# Patient Record
Sex: Male | Born: 1970 | Race: White | Hispanic: No | Marital: Married | Smoking: Former smoker
Health system: Southern US, Community
[De-identification: ages and names within clinical notes are randomized; demographics above are authoritative.]

## PROBLEM LIST (undated history)

## (undated) DIAGNOSIS — E291 Testicular hypofunction: Secondary | ICD-10-CM

## (undated) DIAGNOSIS — K219 Gastro-esophageal reflux disease without esophagitis: Secondary | ICD-10-CM

## (undated) DIAGNOSIS — T7840XA Allergy, unspecified, initial encounter: Secondary | ICD-10-CM

## (undated) HISTORY — PX: HERNIA REPAIR: SHX51

## (undated) HISTORY — DX: Testicular hypofunction: E29.1

## (undated) HISTORY — DX: Allergy, unspecified, initial encounter: T78.40XA

---

## 2002-01-06 HISTORY — PX: BACK SURGERY: SHX140

## 2002-11-30 ENCOUNTER — Observation Stay (HOSPITAL_COMMUNITY): Admission: RE | Admit: 2002-11-30 | Discharge: 2002-12-01 | Payer: Self-pay | Admitting: Orthopedic Surgery

## 2004-01-07 HISTORY — PX: FOOT SURGERY: SHX648

## 2005-07-28 ENCOUNTER — Encounter: Admission: RE | Admit: 2005-07-28 | Discharge: 2005-07-28 | Payer: Self-pay | Admitting: Family Medicine

## 2006-02-11 ENCOUNTER — Encounter: Admission: RE | Admit: 2006-02-11 | Discharge: 2006-02-11 | Payer: Self-pay | Admitting: Family Medicine

## 2006-07-07 ENCOUNTER — Emergency Department (HOSPITAL_COMMUNITY): Admission: EM | Admit: 2006-07-07 | Discharge: 2006-07-07 | Payer: Self-pay | Admitting: Emergency Medicine

## 2006-07-22 ENCOUNTER — Encounter (HOSPITAL_COMMUNITY): Admission: RE | Admit: 2006-07-22 | Discharge: 2006-09-11 | Payer: Self-pay | Admitting: Emergency Medicine

## 2007-09-21 ENCOUNTER — Encounter: Admission: RE | Admit: 2007-09-21 | Discharge: 2007-09-21 | Payer: Self-pay | Admitting: Family Medicine

## 2008-01-07 HISTORY — PX: OTHER SURGICAL HISTORY: SHX169

## 2008-08-17 ENCOUNTER — Ambulatory Visit: Payer: Self-pay | Admitting: Family Medicine

## 2008-10-10 ENCOUNTER — Ambulatory Visit: Payer: Self-pay | Admitting: Family Medicine

## 2008-10-30 ENCOUNTER — Ambulatory Visit: Payer: Self-pay | Admitting: Family Medicine

## 2008-12-19 ENCOUNTER — Ambulatory Visit: Payer: Self-pay | Admitting: Family Medicine

## 2009-04-10 ENCOUNTER — Ambulatory Visit: Payer: Self-pay | Admitting: Family Medicine

## 2009-04-13 ENCOUNTER — Ambulatory Visit: Payer: Self-pay | Admitting: Family Medicine

## 2009-05-16 ENCOUNTER — Ambulatory Visit: Payer: Self-pay | Admitting: Family Medicine

## 2009-09-25 ENCOUNTER — Ambulatory Visit: Payer: Self-pay | Admitting: Family Medicine

## 2009-10-25 ENCOUNTER — Ambulatory Visit: Payer: Self-pay | Admitting: Family Medicine

## 2009-10-29 ENCOUNTER — Ambulatory Visit: Payer: Self-pay | Admitting: Family Medicine

## 2009-12-12 ENCOUNTER — Ambulatory Visit: Payer: Self-pay | Admitting: Family Medicine

## 2010-02-08 ENCOUNTER — Ambulatory Visit (HOSPITAL_BASED_OUTPATIENT_CLINIC_OR_DEPARTMENT_OTHER)
Admission: RE | Admit: 2010-02-08 | Discharge: 2010-02-08 | Disposition: A | Discharge status: Home / Self Care | Payer: BC Managed Care – PPO | Attending: Orthopedic Surgery | Admitting: Orthopedic Surgery

## 2010-02-08 DIAGNOSIS — M23329 Other meniscus derangements, posterior horn of medial meniscus, unspecified knee: Secondary | ICD-10-CM | POA: Insufficient documentation

## 2010-02-08 DIAGNOSIS — M2240 Chondromalacia patellae, unspecified knee: Secondary | ICD-10-CM | POA: Insufficient documentation

## 2010-02-08 LAB — POCT I-STAT, CHEM 8
BUN: 25 mg/dL — ABNORMAL HIGH (ref 6–23)
Calcium, Ion: 1.15 mmol/L (ref 1.12–1.32)
Chloride: 104 mEq/L (ref 96–112)
Creatinine, Ser: 0.9 mg/dL (ref 0.4–1.5)
Glucose, Bld: 101 mg/dL — ABNORMAL HIGH (ref 70–99)
HCT: 41 % (ref 39.0–52.0)
Hemoglobin: 13.9 g/dL (ref 13.0–17.0)
Potassium: 3.7 mEq/L (ref 3.5–5.1)
Sodium: 142 mEq/L (ref 135–145)
TCO2: 29 mmol/L (ref 0–100)

## 2010-02-12 NOTE — Op Note (Signed)
Jeff Hoffman, Jeff Hoffman               ACCOUNT NO.:  [id number]  MEDICAL RECORD NO.:  [id number]           PATIENT TYPE:  LOCATION:                                 FACILITY:  PHYSICIAN:  Harvie Junior, M.D.   DATE OF BIRTH:  Dec 13, 1970  DATE OF PROCEDURE:  02/08/2010 DATE OF DISCHARGE:                              OPERATIVE REPORT   PREOPERATIVE DIAGNOSIS:  Medial meniscal tear.  POSTOPERATIVE DIAGNOSES: 1. Medial meniscal tear. 2. 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  SURGEON:  Harvie Junior, MD  ASSISTANT:  None.  ANESTHESIA:  General.  SURGICAL PROCEDURES: 1. Partial posterior medial horn meniscectomy. 2. Chondroplasty, patellofemoral joint.  BRIEF HISTORY:  Mr. Beegle is a gentleman who has had a long history of knee pain with catching and locking.  He was treated conservatively for a long period of time.  After failure of conservative care, he was ultimately taken to the operating room for knee arthroscopy. Preoperative MRI showed that he had a posterior horn medial meniscal tear.  PROCEDURE:  The patient was taken to the operating room.  After adequate level of anesthesia was obtained with general anesthetic, the patient was placed supine on the operating table.  The right leg was then prepped and draped in the usual sterile fashion.  Following this, routine arthroscopic examination of knee revealed there was an obvious trochlear chondromalacia.  This was debrided back to a smooth and stable rim.  There was a bit of the medial shelf plica which was debrided getting into the medial compartment.  There was a classic posterior horn medial meniscal tear, essentially horizontal in nature with some complex features.  This went from almost all the way posterior by the root around to just sure shot of the midbody.  We resected this with a combination of straight biting forceps and upbiting forceps, and remaining meniscal rim was contoured down  with a suction shaver.  Once this was completed, a probe was used to make sure there was no loosened or fragmenting pieces of meniscus.  Once this was accepted, the knee was thoroughly irrigated.  Attention was turned to the notch.  The ACL was normal, lateral side normal, back up into the trochlea looking and cleaning up the defect here.  At this point, the knee was copiously and thoroughly lavaged and suctioned dry.  We milked the back of the knee to get out any loose or fragment pieces of meniscus and cartilage.  Seeing none, the knee was again thoroughly irrigated, suctioned dry, irrigated with 6 liters of normal saline irrigation, suctioned dry.  The knee arthroscopic portals were closed with bandage.  20 mL of one 0.25% Marcaine was instilled into around the portals and into the knee for postoperative pain control.  The patient was taken to the recovery room where he was noted to be in satisfactory condition.  Estimated blood loss of procedure was none.     Harvie Junior, M.D.     Ranae Plumber  D:  02/08/2010  T:  02/09/2010  Job:  [id number]  Electronically Signed by Jodi Geralds M.D. on 02/12/2010 01:47:02 PM

## 2010-02-18 ENCOUNTER — Ambulatory Visit (INDEPENDENT_AMBULATORY_CARE_PROVIDER_SITE_OTHER): Payer: BC Managed Care – PPO | Admitting: Family Medicine

## 2010-02-18 DIAGNOSIS — B070 Plantar wart: Secondary | ICD-10-CM

## 2010-04-09 ENCOUNTER — Ambulatory Visit (INDEPENDENT_AMBULATORY_CARE_PROVIDER_SITE_OTHER): Payer: BC Managed Care – PPO | Admitting: Family Medicine

## 2010-04-09 DIAGNOSIS — B070 Plantar wart: Secondary | ICD-10-CM

## 2010-04-09 DIAGNOSIS — R5381 Other malaise: Secondary | ICD-10-CM

## 2010-04-09 DIAGNOSIS — R5383 Other fatigue: Secondary | ICD-10-CM

## 2010-04-09 DIAGNOSIS — R6889 Other general symptoms and signs: Secondary | ICD-10-CM

## 2010-04-11 ENCOUNTER — Ambulatory Visit (INDEPENDENT_AMBULATORY_CARE_PROVIDER_SITE_OTHER): Payer: BC Managed Care – PPO

## 2010-04-11 DIAGNOSIS — Z79899 Other long term (current) drug therapy: Secondary | ICD-10-CM

## 2010-04-15 ENCOUNTER — Ambulatory Visit: Payer: BC Managed Care – PPO

## 2010-05-10 ENCOUNTER — Other Ambulatory Visit (INDEPENDENT_AMBULATORY_CARE_PROVIDER_SITE_OTHER): Payer: BC Managed Care – PPO

## 2010-05-10 DIAGNOSIS — R6882 Decreased libido: Secondary | ICD-10-CM

## 2010-05-24 NOTE — Op Note (Signed)
NAME:  Jeff Hoffman, Jeff Hoffman                         ACCOUNT NO.:  [id number]   MEDICAL RECORD NO.:  [id number]                   PATIENT TYPE:  AMB   LOCATION:  DAY                                  FACILITY:  [facility]   PHYSICIAN:  Georges Lynch. Darrelyn Hillock, M.D.             DATE OF BIRTH:  29-Oct-1970   DATE OF PROCEDURE:  11/30/2002  DATE OF DISCHARGE:                                 OPERATIVE REPORT   SURGEON:  Georges Lynch. Darrelyn Hillock, M.D.   ASSISTANT:  Ronnell Guadalajara, M.D.   PREOPERATIVE DIAGNOSIS:  Large herniated lumbar disk at L5-S1 on the left.   POSTOPERATIVE DIAGNOSIS:  Large herniated lumbar disk at L5-S1 on the left.   OPERATION:  Hemilaminectomy and microdiskectomy at L5-S1 on the left.   DESCRIPTION OF PROCEDURE:  Under general anesthesia, the patient first had 1  g of IV Ancef. At this time, two needles were placed over the back, x-rays  were taken to verify our position. Following this, we made an incision over  the L5-S1 interspace. Bleeders identified and cauterized. The incision was  carried down to the lamina and spinous processes of L5-S1. Another x-ray was  taken to verify our position and we compared the plain x-ray lateral view  with the MRI and verified that we were in the proper position. We then  carried out a hemilaminectomy in the usual fashion. We removed the  ligamentum flavum with great care taken not to injure the underlying dura.  We then identified the nerve root and then gently retracted the root, went  down and he had multiple loose pieces of disk material actually that came  out of the disk space and were literally pressing on the nerve. We removed  those gently. I then went down and made a cruciate incision in the space,  there was barely anything in the space, it was a very narrow space. I  cleaned that out and then searched further for more fragments. We down to  check the root. We were able to easily pass a Penfield 4 up around the root  and the nerve root and  the dura now was free and there were no other free  fragments. We did go subligamentous as well with the Epstein curettes and  made sure there were no other loose pieces. We thoroughly irrigated out the  area, the nerve root now was free as well as the dura. I then loosely  applied some thrombin soaked Gelfoam and closed the wound in layers in the  usual fashion. The deep proximal portion of the wound was left open for  drainage purposes. The remaining part of the wound was closed in the usual  fashion. The skin was closed with metal staples. A sterile Neosporin  dressing was applied and the patient left the operating room in satisfactory  condition.  Ronald A. Darrelyn Hillock, M.D.    RAG/MEDQ  D:  11/30/2002  T:  11/30/2002  Job:  [id number]

## 2010-06-21 ENCOUNTER — Ambulatory Visit (INDEPENDENT_AMBULATORY_CARE_PROVIDER_SITE_OTHER): Payer: BC Managed Care – PPO | Admitting: Family Medicine

## 2010-06-21 ENCOUNTER — Encounter: Payer: Self-pay | Admitting: Family Medicine

## 2010-06-21 VITALS — BP 120/80 | Temp 98.0°F | Wt 194.0 lb

## 2010-06-21 DIAGNOSIS — N419 Inflammatory disease of prostate, unspecified: Secondary | ICD-10-CM

## 2010-06-21 DIAGNOSIS — N390 Urinary tract infection, site not specified: Secondary | ICD-10-CM

## 2010-06-21 DIAGNOSIS — Z79899 Other long term (current) drug therapy: Secondary | ICD-10-CM

## 2010-06-21 DIAGNOSIS — E291 Testicular hypofunction: Secondary | ICD-10-CM

## 2010-06-21 LAB — POCT URINALYSIS DIPSTICK
Bilirubin, UA: NEGATIVE
Blood, UA: 250
Glucose, UA: NEGATIVE
Ketones, UA: NEGATIVE
Nitrite, UA: NEGATIVE
Spec Grav, UA: 1.015
Urobilinogen, UA: NEGATIVE
pH, UA: 7

## 2010-06-21 LAB — TESTOSTERONE: Testosterone: 486.09 ng/dL (ref 250–890)

## 2010-06-21 MED ORDER — DOXYCYCLINE HYCLATE 100 MG PO TABS: 100.0000 mg | ORAL_TABLET | Freq: Two times a day (BID) | ORAL | Status: AC

## 2010-06-21 NOTE — Patient Instructions (Signed)
Take all the antibiotic and if not totally back to normal, give me a call

## 2010-06-21 NOTE — Progress Notes (Signed)
  Subjective:    Patient ID: Jeff Hoffman, male    DOB: [birth year], 40 y.o.   MRN: [id number]  HPI He has a one-week history of dysuria and now recent discharge. He has had some cloudy urine in the past. He has had some recent sexual activity but states it was safe. He is again having back pain. He recently had an injection procedure for his back for treatment of facet pain. He continues on testosterone replacement and is having no difficulty with this.  Review of Systems     Objective:   Physical Exam Alert and in no distress. Abdominal exam shows no masses or tenderness. Genitalia and penis normal. GC committee culture taken. Rectal exam does show a tender boggy prostate reproduces symptoms.       Assessment & Plan:  Prostatitis. Possible STD. Hypogonadism. GC committee culture taken. I will place him on doxycycline to help with this. Testosterone level will be drawn.

## 2010-06-22 LAB — GC/CHLAMYDIA PROBE AMP, GENITAL
Chlamydia, DNA Probe: NEGATIVE
GC Probe Amp, Genital: NEGATIVE

## 2010-06-24 ENCOUNTER — Telehealth: Payer: Self-pay

## 2010-06-24 ENCOUNTER — Ambulatory Visit (INDEPENDENT_AMBULATORY_CARE_PROVIDER_SITE_OTHER): Payer: BC Managed Care – PPO | Admitting: Medical

## 2010-06-24 ENCOUNTER — Encounter: Payer: Self-pay | Admitting: Medical

## 2010-06-24 VITALS — BP 150/100 | HR 83 | Temp 98.0°F | Ht 74.0 in | Wt 200.0 lb

## 2010-06-24 DIAGNOSIS — N419 Inflammatory disease of prostate, unspecified: Secondary | ICD-10-CM

## 2010-06-24 DIAGNOSIS — R030 Elevated blood-pressure reading, without diagnosis of hypertension: Secondary | ICD-10-CM

## 2010-06-24 DIAGNOSIS — N453 Epididymo-orchitis: Secondary | ICD-10-CM

## 2010-06-24 DIAGNOSIS — N451 Epididymitis: Secondary | ICD-10-CM

## 2010-06-24 MED ORDER — CIPROFLOXACIN HCL 500 MG PO TABS: 500.0000 mg | ORAL_TABLET | Freq: Two times a day (BID) | ORAL | Status: AC

## 2010-06-24 NOTE — Patient Instructions (Signed)
Call report in 1 to 2 days.

## 2010-06-24 NOTE — Telephone Encounter (Signed)
Had to leave message to call me back

## 2010-06-24 NOTE — Progress Notes (Signed)
Subjective:   HPI  Jeff Hoffman is a 40 y.o. male who presents for recheck.  He was here last week late in the week and saw Dr. Susann Givens for prostatitis and penile discharge.  Had swabs taken, and started on Doxycycline.  However, he is back today due to new and worse symptoms over the weekend.  Over the weekend had severe abdominal and back pain with associated constipation that eventually resolved.  Has had no more penile discharge, but he does note left testicle and scrotum with warmth, redness, swelling, and very tender.  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 He has never had swelling and pain in the testicle such as the current symptoms.  He has had some ongoing lower abdominal pain, but the worse issue is the testicle.   Denies perineal pain, rectal pain, no penile discharge or urinary issues currently.  Denies any recent trauma or injury to the abdominal, back, or pelvic region.  No sexual activity since before his last visit.  No other aggravating or relieving factors.  No other c/o.  The following portions of the patient's history were reviewed and updated as appropriate: allergies, current medications, past family history, past medical history, past social history, past surgical history and problem list.  Past Medical History  Diagnosis Date  . Allergy   . Asthma   . Hypogonadism male     Review of Systems Constitutional: denies fever, chills, sweats, unexpected weight change, anorexia, fatigue Dermatology: denies rash Cardiology: denies chest pain, palpitations, edema Respiratory: denies cough, shortness of breath, wheezing Gastroenterology: denies nausea, vomiting, diarrhea Hematology: denies bleeding or bruising problems Musculoskeletal: denies arthralgias, myalgias, joint swelling Urology: denies dysuria, difficulty urinating, hematuria, urinary frequency,  urgency Neurology: no headache, weakness, tingling, numbness     Objective:   Physical Exam  General appearance: alert, no distress, WD/WN, in pain, white male Abdomen: +bs, soft, mild suprapubic tenderness, otherwise non tender, non distended, no masses, no hepatomegaly, no splenomegaly Back: non tender Musculoskeletal: nontender, no swelling, no obvious deformity GU: left testicle and epididymal region with erythema, swelling, and quite tender throughout. No induration or fluctuance.  Otherwise GU exam unremarkable.   Extremities: no edema, no cyanosis, no clubbing Pulses: 2+ symmetric, upper and lower extremities, normal cap refill Rectal exam: deferred.   Assessment :    Encounter Diagnoses  Name Primary?  . Epididymitis, left Yes  . Prostatitis   . Elevated blood pressure reading without diagnosis of hypertension     Plan:  Reviewed office notes from last week.  Reviewed his negative GC and chlamydia results.  Epididymitis and prostatitis - change to Cipro x 2 wk.  If any residual symptoms at 2 wk, will c/t for  1 whole month of Cipro given hx/o chronic prostatitis.  Advised he call report in 1-2 days.  If not improving or worse, we will consider CBC, repeat urine, and testicular ultrasound.  Elevated BP- recheck with nurse visit in 1wk.

## 2010-06-24 NOTE — Telephone Encounter (Signed)
Pt infomed of labs

## 2010-06-26 ENCOUNTER — Telehealth: Payer: Self-pay | Admitting: *Deleted

## 2010-06-26 NOTE — Telephone Encounter (Signed)
Done

## 2010-07-09 ENCOUNTER — Encounter: Payer: Self-pay | Admitting: Family Medicine

## 2010-07-09 ENCOUNTER — Ambulatory Visit (INDEPENDENT_AMBULATORY_CARE_PROVIDER_SITE_OTHER): Payer: BC Managed Care – PPO | Admitting: Family Medicine

## 2010-07-09 DIAGNOSIS — N410 Acute prostatitis: Secondary | ICD-10-CM

## 2010-07-09 DIAGNOSIS — E291 Testicular hypofunction: Secondary | ICD-10-CM

## 2010-07-09 LAB — POCT URINALYSIS DIPSTICK
Bilirubin, UA: NEGATIVE
Blood, UA: NEGATIVE
Glucose, UA: NEGATIVE
Ketones, UA: NEGATIVE
Leukocytes, UA: NEGATIVE
Nitrite, UA: NEGATIVE
Protein, UA: NEGATIVE
Spec Grav, UA: 1.02
Urobilinogen, UA: NEGATIVE
pH, UA: 5

## 2010-07-09 NOTE — Patient Instructions (Signed)
Try the Viagra at least an hour before sexual activity. For this we'll get things to return to normal. If not there are more things we can do.

## 2010-07-09 NOTE — Progress Notes (Signed)
  Subjective:    Patient ID: Jeff Hoffman, male    DOB: Jul 19, 1970, 40 y.o.   MRN: [id number]  HPI He is here for recheck. He is having no testicular, perineal or back pain He also continues to have difficulty with getting and maintaining erections. He dates this to potentially when he was using sex toys including the use of electricity. His testosterone level is in the high 400 range. He is also interested in potentially having the testosterone pellets. Review of Systems     Objective:   Physical Exam Alert and in no distress otherwise not examined. Urinalysis microscopic was negative       Assessment & Plan:  Prostatitis/epididymitis. ED. Hypogonadism I will refer him to urology for consult concerning the pellets.

## 2010-07-16 ENCOUNTER — Telehealth: Payer: Self-pay | Admitting: Family Medicine

## 2010-07-16 NOTE — Telephone Encounter (Signed)
Have him make an appointment with me so I can look at it and save him a trip to the dermatologist

## 2010-07-17 ENCOUNTER — Telehealth: Payer: Self-pay

## 2010-07-17 NOTE — Telephone Encounter (Signed)
Pt dosent want to come in he wants to go to derm

## 2010-08-19 ENCOUNTER — Other Ambulatory Visit: Payer: Self-pay | Admitting: Family Medicine

## 2010-08-19 MED ORDER — MONTELUKAST SODIUM 10 MG PO TABS: 10.0000 mg | ORAL_TABLET | Freq: Every day | ORAL | Status: DC

## 2010-08-21 ENCOUNTER — Encounter: Payer: Self-pay | Admitting: Family Medicine

## 2010-09-02 ENCOUNTER — Ambulatory Visit (INDEPENDENT_AMBULATORY_CARE_PROVIDER_SITE_OTHER): Payer: BC Managed Care – PPO | Admitting: Family Medicine

## 2010-09-02 ENCOUNTER — Encounter: Payer: Self-pay | Admitting: Family Medicine

## 2010-09-02 VITALS — BP 118/76 | HR 63 | Wt 204.0 lb

## 2010-09-02 DIAGNOSIS — L739 Follicular disorder, unspecified: Secondary | ICD-10-CM

## 2010-09-02 DIAGNOSIS — Z23 Encounter for immunization: Secondary | ICD-10-CM

## 2010-09-02 DIAGNOSIS — L738 Other specified follicular disorders: Secondary | ICD-10-CM

## 2010-09-02 DIAGNOSIS — B001 Herpesviral vesicular dermatitis: Secondary | ICD-10-CM

## 2010-09-02 DIAGNOSIS — Z209 Contact with and (suspected) exposure to unspecified communicable disease: Secondary | ICD-10-CM

## 2010-09-02 DIAGNOSIS — E291 Testicular hypofunction: Secondary | ICD-10-CM

## 2010-09-02 DIAGNOSIS — B009 Herpesviral infection, unspecified: Secondary | ICD-10-CM

## 2010-09-02 DIAGNOSIS — H6590 Unspecified nonsuppurative otitis media, unspecified ear: Secondary | ICD-10-CM

## 2010-09-02 DIAGNOSIS — H6592 Unspecified nonsuppurative otitis media, left ear: Secondary | ICD-10-CM

## 2010-09-02 MED ORDER — ACYCLOVIR 400 MG PO TABS: 400.0000 mg | ORAL_TABLET | Freq: Two times a day (BID) | ORAL | Status: AC

## 2010-09-02 MED ORDER — EMTRICITABINE-TENOFOVIR DF 200-300 MG PO TABS: 1.0000 | ORAL_TABLET | Freq: Every day | ORAL | Status: DC

## 2010-09-02 NOTE — Progress Notes (Signed)
  Subjective:    Patient ID: Jeff Hoffman, male    DOB: [birth year], 40 y.o.   MRN: [id number]  HPI He complains of feeling that the left ear is plugged. He's had no earache, sore throat, cough or congestion. He does get occasional outbreaks of folliculitis mainly on his legs. He uses regular soap and does February which does help. He also has questions about STD exposure. He is interested in being placed on Truvada to help reduce his potential exposure. He is being followed by urology for hypogonadism and at this time he is not using testosterone. He would also like to switch to a less expensive medication for his herpes.   Review of Systems     Objective:   Physical Exam alert and in no distress. Tympanic membranes and canals are normal. Throat is clear. Tonsils are normal. Neck is supple without adenopathy or thyromegaly. Cardiac exam shows a regular sinus rhythm without murmurs or gallops. Lungs are clear to auscultation. Exam of his legs shows no lesions.      Assessment & Plan:  Probable otitis media. STD exposure. Folliculitis. Hypogonadism Over 45 minutes spent discussing all these issues with him. I will switch him to acyclovir. I will also start him on Truvada. We discussed the need for him to continue to practice safe sex. I would check an RPR and HIV. We'll also get notes from neurology. Continue to treat his skin follow him as he has in the past.

## 2010-09-03 ENCOUNTER — Telehealth: Payer: Self-pay

## 2010-09-03 LAB — RPR

## 2010-09-03 LAB — HIV ANTIBODY (ROUTINE TESTING W REFLEX): HIV: NONREACTIVE

## 2010-09-03 NOTE — Telephone Encounter (Signed)
Called pt to inform him labs are neg.left message

## 2010-09-10 ENCOUNTER — Other Ambulatory Visit: Payer: Self-pay | Admitting: Family Medicine

## 2010-09-10 MED ORDER — AMOXICILLIN-POT CLAVULANATE 875-125 MG PO TABS: 1.0000 | ORAL_TABLET | Freq: Two times a day (BID) | ORAL | Status: AC

## 2010-09-10 MED ORDER — ALBUTEROL SULFATE HFA 108 (90 BASE) MCG/ACT IN AERS: 2.0000 | INHALATION_SPRAY | Freq: Four times a day (QID) | RESPIRATORY_TRACT | Status: DC | PRN

## 2010-09-10 NOTE — Telephone Encounter (Signed)
Pt called again and in a huff wanted to know when we were going to call something in wants to pick up at lunch per sharron

## 2010-09-10 NOTE — Telephone Encounter (Signed)
His cold symptoms have gotten worse with more chest and nasal congestion as well as a slightly productive cough. I will give him an inhaler and Augmentin which has worked in the past.

## 2010-09-10 NOTE — Telephone Encounter (Signed)
Pt said self treating not working.  Pt now has bronchitis.  Flying out tomorrow am and needs antibiotic and new rx for Albuterol.   NEEDS BY LUNCH at [facility]

## 2010-11-18 ENCOUNTER — Other Ambulatory Visit: Payer: Self-pay | Admitting: Family Medicine

## 2010-11-18 NOTE — Telephone Encounter (Signed)
Is this okay?

## 2010-12-16 ENCOUNTER — Other Ambulatory Visit: Payer: Self-pay | Admitting: Family Medicine

## 2011-01-13 ENCOUNTER — Other Ambulatory Visit: Payer: Self-pay | Admitting: Family Medicine

## 2011-02-17 ENCOUNTER — Other Ambulatory Visit: Payer: Self-pay | Admitting: Family Medicine

## 2011-02-17 NOTE — Telephone Encounter (Signed)
Is this okay?

## 2011-03-17 ENCOUNTER — Other Ambulatory Visit: Payer: Self-pay | Admitting: Family Medicine

## 2011-04-01 ENCOUNTER — Encounter: Payer: Self-pay | Admitting: Medical

## 2011-04-01 ENCOUNTER — Ambulatory Visit (INDEPENDENT_AMBULATORY_CARE_PROVIDER_SITE_OTHER): Payer: BC Managed Care – PPO | Admitting: Medical

## 2011-04-01 VITALS — BP 110/80 | HR 60 | Temp 98.0°F | Resp 16 | Wt 201.0 lb

## 2011-04-01 DIAGNOSIS — Z113 Encounter for screening for infections with a predominantly sexual mode of transmission: Secondary | ICD-10-CM

## 2011-04-01 DIAGNOSIS — K529 Noninfective gastroenteritis and colitis, unspecified: Secondary | ICD-10-CM

## 2011-04-01 DIAGNOSIS — K5289 Other specified noninfective gastroenteritis and colitis: Secondary | ICD-10-CM

## 2011-04-01 NOTE — Progress Notes (Signed)
Subjective: Here for complaint of stomach bug.  This past weekend he was out of town [address], been traveling, and this past Saturday 3 nights ago started having uncontrollable diarrhea, 10-12 episodes Saturday night. Diarrhea was watery, and one time he actually defecated in the sleep. On Saturday night he also had one big episode of nausea and vomiting.  Her last 2 days he has continued to have diarrhea but only about 5 times a day compared to Saturday.  He has not had any more nausea or vomiting.  He does feel dehydrated at this point, no energy, he tried to go back to work today but couldn't due to fatigue.  Denies belly pain, just discomfort in general.  He denies any recent antibiotics, no recent animal exposure, no recent illness or hospitalization. No sick contacts with similar symptoms. He also reports that he normally gets an HIV screen every few months. He would like this today in light of his symptoms.   Past Medical History  Diagnosis Date  . Allergy   . Asthma   . Hypogonadism male    ROS: Gen.: No fever, chills, sweats, weight changes HEENT: Runny nose, otherwise negative Heart: No chest pain or palpitation Lungs: No shortness of breath or wheezing GU: Negative    Objective:   Physical Exam  Filed Vitals:   04/01/11 1153  BP: 110/80  Pulse: 60  Temp: 98 F (36.7 C)  Resp: 16    General appearance: alert, no distress, WD/WN Skin: Unremarkable, no jaundice, no pallor, no skin tenting Eyes: Not icteric, mild pallor Oral cavity: MMM, no lesions Neck: supple, no lymphadenopathy, no thyromegaly, no masses Heart: RRR, normal S1, S2, no murmurs Lungs: CTA bilaterally, no wheezes, rhonchi, or rales Abdomen: +bs, soft, mild generalized tenderness, non distended, no masses, no hepatomegaly, no splenomegaly Pulses: 2+ symmetric, normal cap refill  Assessment and Plan :    Encounter Diagnoses  Name Primary?  . Gastroenteritis Yes  . Screening for venereal disease     Currently symptoms suggest viral gastroenteritis.  Discussed supportive care, rehydration, rest, and discussed signs of worsening infection that would prompt a call back or recheck including fever, blood in stool, worsening diarrhea or nausea and vomiting.  Advised he limit Imodium to 1-2 a day. Handout given. HIV antibody today.  Followup pending labs

## 2011-04-01 NOTE — Patient Instructions (Signed)
Viral Gastroenteritis Viral gastroenteritis is also known as stomach flu. This condition affects the stomach and intestinal tract. The illness typically lasts 3 to 8 days. Most people develop an immune response. This eventually gets rid of the virus. While this natural response develops, the virus can make you quite ill.  CAUSES  Diarrhea and vomiting are often caused by a virus. Medicines (antibiotics) that kill germs will not help unless there is also a germ (bacterial) infection. SYMPTOMS  The most common symptom is diarrhea. This can cause severe loss of fluids (dehydration) and body salt (electrolyte) imbalance. TREATMENT  Treatments for this illness are aimed at rehydration. Antidiarrheal medicines are not recommended. They do not decrease diarrhea volume and may be harmful. Usually, home treatment is all that is needed. The most serious cases involve vomiting so severely that you are not able to keep down fluids taken by mouth (orally). In these cases, intravenous (IV) fluids are needed. Vomiting with viral gastroenteritis is common, but it will usually go away with treatment. HOME CARE INSTRUCTIONS  Small amounts of fluids should be taken frequently. Large amounts at one time may not be tolerated. Plain water may be harmful in infants and the elderly. Oral rehydration solutions (ORS) are available at pharmacies and grocery stores. ORS replace water and important electrolytes in proper proportions. Sports drinks are not as effective as ORS and may be harmful due to sugars worsening diarrhea.  As a general guideline for children, replace any new fluid losses from diarrhea or vomiting with ORS as follows:   If your child weighs 22 pounds or under (10 kg or less), give 60-120 mL (1/4 - 1/2 cup or 2 - 4 ounces) of ORS for each diarrheal stool or vomiting episode.   If your child weighs more than 22 pounds (more than 10 kgs), give 120-240 mL (1/2 - 1 cup or 4 - 8 ounces) of ORS for each diarrheal  stool or vomiting episode.   In a child with vomiting, it may be helpful to give the above ORS replacement in 5 mL (1 teaspoon) amounts every 5 minutes, then increase as tolerated.   While correcting for dehydration, children should eat normally. However, foods high in sugar should be avoided because this may worsen diarrhea. Large amounts of carbonated soft drinks, juice, gelatin desserts, and other highly sugared drinks should be avoided.   After correction of dehydration, other liquids that are appealing to the child may be added. Children should drink small amounts of fluids frequently and fluids should be increased as tolerated.   Adults should eat normally while drinking more fluids than usual. Drink small amounts of fluids frequently and increase as tolerated. Drink enough water and fluids to keep your urine clear or pale yellow. Broths, weak decaffeinated tea, lemon-lime soft drinks (allowed to go flat), and ORS replace fluids and electrolytes.   Avoid:   Carbonated drinks.   Juice.   Extremely hot or cold fluids.   Caffeine drinks.   Fatty, greasy foods.   Alcohol.   Tobacco.   Too much intake of anything at one time.   Gelatin desserts.   Probiotics are active cultures of beneficial bacteria. They may lessen the amount and number of diarrheal stools in adults. Probiotics can be found in yogurt with active cultures and in supplements.   Wash your hands well to avoid spreading bacteria and viruses.   Antidiarrheal medicines are not recommended for infants and children.   Only take over-the-counter or prescription medicines for   pain, discomfort, or fever as directed by your caregiver. Do not give aspirin to children.   For adults with dehydration, ask your caregiver if you should continue all prescribed and over-the-counter medicines.   If your caregiver has given you a follow-up appointment, it is very important to keep that appointment. Not keeping the appointment  could result in a lasting (chronic) or permanent injury and disability. If there is any problem keeping the appointment, you must call to reschedule.  SEEK IMMEDIATE MEDICAL CARE IF:   You are unable to keep fluids down.   There is no urine output in 6 to 8 hours or there is only a small amount of very dark urine.   You develop shortness of breath.   There is blood in the vomit (may look like coffee grounds) or stool.   Belly (abdominal) pain develops, increases, or localizes.   There is persistent vomiting or diarrhea.   You have a fever.   Your baby is older than 3 months with a rectal temperature of 102 F (38.9 C) or higher.   Your baby is 3 months old or younger with a rectal temperature of 100.4 F (38 C) or higher.  MAKE SURE YOU:   Understand these instructions.   Will watch your condition.   Will get help right away if you are not doing well or get worse.  Document Released: 12/23/2004 Document Revised: 09/04/2010 Document Reviewed: 05/06/2006 [employer] Patient Information 2012 [employer]. 

## 2011-04-02 LAB — HIV ANTIBODY (ROUTINE TESTING W REFLEX): HIV: NONREACTIVE

## 2011-04-21 ENCOUNTER — Telehealth: Payer: Self-pay | Admitting: Family Medicine

## 2011-04-21 NOTE — Telephone Encounter (Signed)
  Please call patient. He wants to explore other options instead of using Advair.   He states advair made of 2 components that are available separately in generic form. Would like to try taking those instead   Please call     [facility] in [place]    [phone]

## 2011-04-23 ENCOUNTER — Other Ambulatory Visit: Payer: Self-pay | Admitting: Family Medicine

## 2011-04-24 ENCOUNTER — Telehealth: Payer: Self-pay | Admitting: Internal Medicine

## 2011-04-24 MED ORDER — BECLOMETHASONE DIPROPIONATE 80 MCG/ACT IN AERS: 1.0000 | INHALATION_SPRAY | Freq: Two times a day (BID) | RESPIRATORY_TRACT | Status: DC

## 2011-04-24 MED ORDER — OMEPRAZOLE 40 MG PO CPDR: 40.0000 mg | DELAYED_RELEASE_CAPSULE | Freq: Every day | ORAL | Status: DC

## 2011-04-24 NOTE — Telephone Encounter (Signed)
He would like to be switched to a different asthma medication to help with cost. He checked with his insurance and Qvar has good coverage. I explained to him that this is just a steroid and he is willing to try this. He would also like outside for his underlying GERD. Both of these medicines were called in. He will let me know how he tolerates them

## 2011-05-19 ENCOUNTER — Other Ambulatory Visit: Payer: Self-pay | Admitting: Family Medicine

## 2011-06-16 ENCOUNTER — Other Ambulatory Visit: Payer: Self-pay | Admitting: Family Medicine

## 2011-06-20 ENCOUNTER — Telehealth: Payer: Self-pay | Admitting: Family Medicine

## 2011-06-20 ENCOUNTER — Other Ambulatory Visit: Payer: Self-pay

## 2011-06-20 MED ORDER — AZITHROMYCIN 500 MG PO TABS: 1000.0000 mg | ORAL_TABLET | Freq: Once | ORAL | Status: DC

## 2011-06-20 MED ORDER — DOXYCYCLINE HYCLATE 100 MG PO TABS: 100.0000 mg | ORAL_TABLET | Freq: Two times a day (BID) | ORAL | Status: AC

## 2011-06-20 NOTE — Telephone Encounter (Signed)
Pt informed and appt made but he said med you sent was on back order can please call something else in

## 2011-06-20 NOTE — Telephone Encounter (Signed)
Doxycycline called in for presumed STD. I will have him schedule a followup appointment.

## 2011-06-20 NOTE — Telephone Encounter (Signed)
Tell him that I called in the medication and that have him schedule an appointment for sometime next week

## 2011-06-20 NOTE — Telephone Encounter (Signed)
Call in azithromycin 500 mg. 2 pills at one time.

## 2011-06-24 ENCOUNTER — Ambulatory Visit: Payer: BC Managed Care – PPO | Admitting: Family Medicine

## 2011-07-21 ENCOUNTER — Other Ambulatory Visit: Payer: Self-pay | Admitting: Family Medicine

## 2011-07-29 ENCOUNTER — Other Ambulatory Visit: Payer: Self-pay | Admitting: Family Medicine

## 2011-08-20 ENCOUNTER — Other Ambulatory Visit: Payer: Self-pay

## 2011-08-20 MED ORDER — MONTELUKAST SODIUM 10 MG PO TABS: 10.0000 mg | ORAL_TABLET | Freq: Every day | ORAL | Status: DC

## 2011-08-20 MED ORDER — EMTRICITABINE-TENOFOVIR DF 200-300 MG PO TABS: 1.0000 | ORAL_TABLET | Freq: Every day | ORAL | Status: DC

## 2011-09-08 ENCOUNTER — Other Ambulatory Visit: Payer: Self-pay | Admitting: Family Medicine

## 2011-09-09 ENCOUNTER — Telehealth: Payer: Self-pay | Admitting: Family Medicine

## 2011-09-09 NOTE — Telephone Encounter (Signed)
LM

## 2011-09-17 ENCOUNTER — Other Ambulatory Visit: Payer: Self-pay | Admitting: Family Medicine

## 2011-10-14 ENCOUNTER — Other Ambulatory Visit: Payer: Self-pay | Admitting: Family Medicine

## 2011-10-21 ENCOUNTER — Other Ambulatory Visit: Payer: Self-pay | Admitting: Family Medicine

## 2011-11-08 ENCOUNTER — Other Ambulatory Visit: Payer: Self-pay | Admitting: Family Medicine

## 2011-11-10 NOTE — Telephone Encounter (Signed)
Okay to renew but he needs an appointment only give him 30 days

## 2011-11-10 NOTE — Telephone Encounter (Signed)
IS THIS OK 

## 2011-11-19 ENCOUNTER — Other Ambulatory Visit: Payer: Self-pay | Admitting: Family Medicine

## 2011-12-16 ENCOUNTER — Telehealth: Payer: Self-pay | Admitting: Family Medicine

## 2011-12-16 MED ORDER — FLUTICASONE PROPIONATE 50 MCG/ACT NA SUSP: 2.0000 | Freq: Every day | NASAL | Status: DC

## 2011-12-16 MED ORDER — ACYCLOVIR 400 MG PO TABS: 400.0000 mg | ORAL_TABLET | Freq: Two times a day (BID) | ORAL | Status: DC

## 2011-12-16 NOTE — Telephone Encounter (Signed)
I renewed the acyclovir and fluticasone. He is now seeing a physician in [place]. He will have his medications followed by that particular physician. He also is getting HIV testing on regular basis.

## 2012-01-14 ENCOUNTER — Other Ambulatory Visit: Payer: Self-pay | Admitting: Family Medicine

## 2012-01-20 ENCOUNTER — Telehealth: Payer: Self-pay | Admitting: Family Medicine

## 2012-01-20 MED ORDER — EMTRICITABINE-TENOFOVIR DF 200-300 MG PO TABS: 1.0000 | ORAL_TABLET | Freq: Every day | ORAL | Status: DC

## 2012-01-20 MED ORDER — ACYCLOVIR 400 MG PO TABS: 400.0000 mg | ORAL_TABLET | Freq: Two times a day (BID) | ORAL | Status: DC

## 2012-01-20 NOTE — Telephone Encounter (Signed)
Talked with pt he has apt end of mth with new dr so i filled meds 30 days only

## 2012-01-20 NOTE — Telephone Encounter (Signed)
Renew the meds but for one or 2 months. make sure he schedules an appointment to see his new doctor

## 2012-03-16 ENCOUNTER — Other Ambulatory Visit: Payer: Self-pay | Admitting: Family Medicine

## 2012-06-17 ENCOUNTER — Other Ambulatory Visit: Payer: Self-pay | Admitting: Family Medicine

## 2013-02-28 ENCOUNTER — Ambulatory Visit (INDEPENDENT_AMBULATORY_CARE_PROVIDER_SITE_OTHER): Payer: BC Managed Care – PPO | Admitting: Family Medicine

## 2013-02-28 ENCOUNTER — Encounter: Payer: Self-pay | Admitting: Family Medicine

## 2013-02-28 VITALS — BP 118/70 | HR 72 | Ht 73.0 in | Wt 212.0 lb

## 2013-02-28 DIAGNOSIS — K219 Gastro-esophageal reflux disease without esophagitis: Secondary | ICD-10-CM | POA: Insufficient documentation

## 2013-02-28 DIAGNOSIS — E291 Testicular hypofunction: Secondary | ICD-10-CM

## 2013-02-28 DIAGNOSIS — B009 Herpesviral infection, unspecified: Secondary | ICD-10-CM

## 2013-02-28 DIAGNOSIS — Z209 Contact with and (suspected) exposure to unspecified communicable disease: Secondary | ICD-10-CM

## 2013-02-28 DIAGNOSIS — J309 Allergic rhinitis, unspecified: Secondary | ICD-10-CM

## 2013-02-28 DIAGNOSIS — J45909 Unspecified asthma, uncomplicated: Secondary | ICD-10-CM

## 2013-02-28 DIAGNOSIS — Z0000 Encounter for general adult medical examination without abnormal findings: Secondary | ICD-10-CM

## 2013-02-28 LAB — HEMOCCULT GUIAC POC 1CARD (OFFICE)

## 2013-02-28 LAB — LIPID PANEL
Cholesterol: 154 mg/dL (ref 0–200)
HDL: 33 mg/dL — ABNORMAL LOW (ref >39)
LDL CALC: 105 mg/dL — AB (ref 0–99)
TRIGLYCERIDES: 79 mg/dL (ref <150)
Total CHOL/HDL Ratio: 4.7 Ratio
VLDL: 16 mg/dL (ref 0–40)

## 2013-02-28 LAB — COMPREHENSIVE METABOLIC PANEL
ALK PHOS: 81 U/L (ref 39–117)
ALT: 81 U/L — ABNORMAL HIGH (ref 0–53)
AST: 152 U/L — AB (ref 0–37)
Albumin: 4.7 g/dL (ref 3.5–5.2)
BUN: 22 mg/dL (ref 6–23)
CO2: 27 mEq/L (ref 19–32)
Calcium: 9.2 mg/dL (ref 8.4–10.5)
Chloride: 102 mEq/L (ref 96–112)
Creat: 0.85 mg/dL (ref 0.50–1.35)
Glucose, Bld: 97 mg/dL (ref 70–99)
Potassium: 4 mEq/L (ref 3.5–5.3)
Sodium: 139 mEq/L (ref 135–145)
Total Bilirubin: 0.5 mg/dL (ref 0.2–1.2)
Total Protein: 7 g/dL (ref 6.0–8.3)

## 2013-02-28 LAB — RPR

## 2013-02-28 LAB — CBC WITH DIFFERENTIAL/PLATELET
BASOS ABS: 0 10*3/uL (ref 0.0–0.1)
BASOS PCT: 0 % (ref 0–1)
EOS PCT: 1 % (ref 0–5)
Eosinophils Absolute: 0 10*3/uL (ref 0.0–0.7)
HEMATOCRIT: 41 % (ref 39.0–52.0)
Hemoglobin: 14 g/dL (ref 13.0–17.0)
Lymphocytes Relative: 30 % (ref 12–46)
Lymphs Abs: 1.4 10*3/uL (ref 0.7–4.0)
MCH: 29.5 pg (ref 26.0–34.0)
MCHC: 34.1 g/dL (ref 30.0–36.0)
MCV: 86.3 fL (ref 78.0–100.0)
MONO ABS: 0.5 10*3/uL (ref 0.1–1.0)
Monocytes Relative: 10 % (ref 3–12)
Neutro Abs: 2.7 10*3/uL (ref 1.7–7.7)
Neutrophils Relative %: 59 % (ref 43–77)
Platelets: 182 10*3/uL (ref 150–400)
RBC: 4.75 MIL/uL (ref 4.22–5.81)
RDW: 14 % (ref 11.5–15.5)
WBC: 4.6 10*3/uL (ref 4.0–10.5)

## 2013-02-28 LAB — MAGNESIUM: MAGNESIUM: 1.9 mg/dL (ref 1.5–2.5)

## 2013-02-28 MED ORDER — EMTRICITABINE-TENOFOVIR DF 200-300 MG PO TABS: 1.0000 | ORAL_TABLET | Freq: Every day | ORAL | Status: DC

## 2013-02-28 MED ORDER — BECLOMETHASONE DIPROPIONATE 80 MCG/ACT IN AERS: 1.0000 | INHALATION_SPRAY | Freq: Two times a day (BID) | RESPIRATORY_TRACT | Status: DC

## 2013-02-28 MED ORDER — MONTELUKAST SODIUM 10 MG PO TABS: ORAL_TABLET | ORAL | Status: DC

## 2013-02-28 MED ORDER — FLUTICASONE PROPIONATE 50 MCG/ACT NA SUSP: 2.0000 | Freq: Every day | NASAL | Status: DC

## 2013-02-28 MED ORDER — OMEPRAZOLE 40 MG PO CPDR: DELAYED_RELEASE_CAPSULE | ORAL | Status: DC

## 2013-02-28 MED ORDER — CETIRIZINE HCL 10 MG PO TABS: 10.0000 mg | ORAL_TABLET | Freq: Every day | ORAL | Status: DC

## 2013-02-28 MED ORDER — AZELASTINE HCL 0.1 % NA SOLN: 1.0000 | Freq: Two times a day (BID) | NASAL | Status: DC

## 2013-02-28 NOTE — Patient Instructions (Signed)
Keep in touch in regard to the lesions in your armpit and on your chest.

## 2013-02-28 NOTE — Progress Notes (Signed)
Subjective:    Patient ID: Jeff Hoffman, male    DOB: 12-05-1970, 43 y.o.   MRN: [id number]  HPI He is here for a complete examination. He would be moving back to [place]. He was previously taking care of by a physician in [place]. He continues on  PREP and does get HIV testing every 3-6 months. He is doing well on this regimen. He does need followup on this as well as other STDs. He is having no symptoms. He does have a previous history of herpes that was present on his torso. He has been on acyclovir for several years but has not had a breakout. He was also using an antifungal medication and has axilla for treatment of presumed fungal infection although he states that the previous doctor was not sure of that diagnosis. He has a previous history of low testosterone and has been on no replacement for at least a year. He has noted some erectile dysfunction issues. He has tried Viagra but apparently caused headaches. His reflux is under good control on his present dosing regimen. This was diagnosed when he was much younger. He does note that when he stops the medication within a day he has recurrence of his symptoms. His allergies and asthma are under good control. He has no other concerns or complaints. His social and family history were reviewed.  Review of Systems  All other systems reviewed and are negative.       Objective:   Physical Exam BP 118/70  Pulse 72  Ht 6\' 1"  (1.854 m)  Wt 212 lb (96.163 kg)  BMI 27.98 kg/m2  General Appearance:    Alert, cooperative, no distress, appears stated age  Head:    Normocephalic, without obvious abnormality, atraumatic  Eyes:    PERRL, conjunctiva/corneas clear, EOM's intact, fundi    benign  Ears:    Normal TM's and external ear canals  Nose:   Nares normal, mucosa normal, no drainage or sinus   tenderness  Throat:   Lips, mucosa, and tongue normal; teeth and gums normal  Neck:   Supple, no lymphadenopathy;  thyroid:  no    enlargement/tenderness/nodules; no carotid   bruit or JVD  Back:    Spine nontender, no curvature, ROM normal, no CVA     tenderness  Lungs:     Clear to auscultation bilaterally without wheezes, rales or     ronchi; respirations unlabored  Chest Wall:    No tenderness or deformity   Heart:    Regular rate and rhythm, S1 and S2 normal, no murmur, rub   or gallop  Breast Exam:    No chest wall tenderness, masses or gynecomastia  Abdomen:     Soft, non-tender, nondistended, normoactive bowel sounds,    no masses, no hepatosplenomegaly  Genitalia:    Normal male external genitalia without lesions.  Testicles without masses.  No inguinal hernias.  Rectal:    Normal sphincter tone, no masses or tenderness; guaiac negative stool.  Prostate smooth, no nodules, not enlarged.  Extremities:   No clubbing, cyanosis or edema  Pulses:   2+ and symmetric all extremities  Skin:   Skin color, texture, turgor normal, no rashes or lesions  Lymph nodes:   Cervical, supraclavicular, and axillary nodes normal  Neurologic:   CNII-XII intact, normal strength, sensation and gait; reflexes 2+ and symmetric throughout          Psych:   Normal mood, affect, hygiene and grooming.  Assessment & Plan:  Routine general medical examination at a health care facility - Plan: CBC with Differential, Comprehensive metabolic panel, Lipid panel, Hepatitis B surface antibody, emtricitabine-tenofovir (TRUVADA) 200-300 MG per tablet, POCT occult blood stool  Contact with or exposure to unspecified communicable disease - Plan: HIV antibody, GC/chlamydia probe amp, urine, RPR, emtricitabine-tenofovir (TRUVADA) 200-300 MG per tablet  Hypogonadism male - Plan: Testosterone  Asthma, chronic - Plan: beclomethasone (QVAR) 80 MCG/ACT inhaler, montelukast (SINGULAIR) 10 MG tablet  GERD (gastroesophageal reflux disease) - Plan: Magnesium, omeprazole (PRILOSEC) 40 MG capsule  Allergic rhinitis - Plan: azelastine (ASTELIN) 137  MCG/SPRAY nasal spray, cetirizine (ZYRTEC) 10 MG tablet, fluticasone (FLONASE) 50 MCG/ACT nasal spray  Herpes Recommend he hold on treatment with the acyclovir as well as on treating his axilla unless he has an outbreak. Strongly encouraged him to use condoms with sexual activity. We will readdress the possible ED issue and I get the testosterone level back. Discussed long-term use of PPI with him.

## 2013-03-01 LAB — HIV ANTIBODY (ROUTINE TESTING W REFLEX): HIV: NONREACTIVE

## 2013-03-01 LAB — TESTOSTERONE: Testosterone: 402 ng/dL (ref 300–890)

## 2013-03-01 LAB — HEPATITIS B SURFACE ANTIBODY, QUANTITATIVE: Hepatitis B-Post: 413 m[IU]/mL

## 2013-03-01 LAB — GC/CHLAMYDIA PROBE AMP, URINE
CHLAMYDIA, SWAB/URINE, PCR: NEGATIVE
GC Probe Amp, Urine: NEGATIVE

## 2013-03-07 ENCOUNTER — Encounter: Payer: Self-pay | Admitting: Family Medicine

## 2013-03-17 ENCOUNTER — Ambulatory Visit (INDEPENDENT_AMBULATORY_CARE_PROVIDER_SITE_OTHER): Payer: BC Managed Care – PPO | Admitting: Family Medicine

## 2013-03-17 ENCOUNTER — Encounter: Payer: Self-pay | Admitting: Family Medicine

## 2013-03-17 VITALS — BP 120/74 | HR 60 | Temp 98.2°F | Wt 215.0 lb

## 2013-03-17 DIAGNOSIS — J019 Acute sinusitis, unspecified: Secondary | ICD-10-CM

## 2013-03-17 DIAGNOSIS — R748 Abnormal levels of other serum enzymes: Secondary | ICD-10-CM

## 2013-03-17 DIAGNOSIS — J209 Acute bronchitis, unspecified: Secondary | ICD-10-CM

## 2013-03-17 LAB — COMPREHENSIVE METABOLIC PANEL
ALBUMIN: 4.3 g/dL (ref 3.5–5.2)
ALT: 24 U/L (ref 0–53)
AST: 20 U/L (ref 0–37)
Alkaline Phosphatase: 103 U/L (ref 39–117)
BILIRUBIN TOTAL: 0.5 mg/dL (ref 0.2–1.2)
BUN: 15 mg/dL (ref 6–23)
CHLORIDE: 103 meq/L (ref 96–112)
CO2: 26 meq/L (ref 19–32)
Calcium: 8.9 mg/dL (ref 8.4–10.5)
Creat: 0.72 mg/dL (ref 0.50–1.35)
GLUCOSE: 98 mg/dL (ref 70–99)
Potassium: 4.2 mEq/L (ref 3.5–5.3)
SODIUM: 137 meq/L (ref 135–145)
Total Protein: 6.5 g/dL (ref 6.0–8.3)

## 2013-03-17 MED ORDER — AMOXICILLIN 875 MG PO TABS: 875.0000 mg | ORAL_TABLET | Freq: Two times a day (BID) | ORAL | Status: DC

## 2013-03-17 NOTE — Patient Instructions (Signed)
Take all the medicine and if not totally back to normal when you finish give me a call 

## 2013-03-17 NOTE — Progress Notes (Signed)
   Subjective:    Patient ID: Jeff Hoffman, male    DOB: 10/24/1970, 43 y.o.   MRN: [id number]  HPI Approximately 10 days ago he developed a sore throat followed by nasal congestion, chest congestion and productive cough with chest tightness. He is also had fever, chills and diaphoresis for the last several day was fatigue and malaise. He does not smoke. On his last visit he did have slightly elevated liver enzymes. He is here for followup on that.  Review of Systems     Objective:   Physical Exam alert and in no distress. Tympanic membranes and canals are normal. Throat is clear. Tonsils are normal. Neck is supple without adenopathy or thyromegaly. Cardiac exam shows a regular sinus rhythm without murmurs or gallops. Lungs are clear to auscultation. Nasal mucosa was slightly pink and tender maxillary sinuses.        Assessment & Plan:  Acute sinusitis - Plan: amoxicillin (AMOXIL) 875 MG tablet  Acute bronchitis - Plan: amoxicillin (AMOXIL) 875 MG tablet  Abnormal liver enzymes - Plan: Acute Hep Panel & Hep B Surface Ab, Comprehensive metabolic panel

## 2013-03-18 LAB — ACUTE HEP PANEL AND HEP B SURFACE AB
HCV Ab: NEGATIVE
HEP B C IGM: NONREACTIVE
Hep A IgM: NONREACTIVE
Hep B S Ab: POSITIVE — AB
Hepatitis B Surface Ag: NEGATIVE

## 2013-03-27 ENCOUNTER — Encounter: Payer: Self-pay | Admitting: Family Medicine

## 2013-04-04 ENCOUNTER — Telehealth: Payer: Self-pay | Admitting: Internal Medicine

## 2013-04-04 NOTE — Telephone Encounter (Signed)
Medical records have been received from [facility]

## 2013-05-06 ENCOUNTER — Ambulatory Visit (INDEPENDENT_AMBULATORY_CARE_PROVIDER_SITE_OTHER): Payer: BC Managed Care – PPO | Admitting: Family Medicine

## 2013-05-06 ENCOUNTER — Encounter: Payer: Self-pay | Admitting: Family Medicine

## 2013-05-06 VITALS — BP 130/80 | HR 72 | Resp 16 | Ht 73.0 in | Wt 211.0 lb

## 2013-05-06 DIAGNOSIS — M66249 Spontaneous rupture of extensor tendons, unspecified hand: Secondary | ICD-10-CM

## 2013-05-06 DIAGNOSIS — M66239 Spontaneous rupture of extensor tendons, unspecified forearm: Secondary | ICD-10-CM

## 2013-05-06 DIAGNOSIS — M66241 Spontaneous rupture of extensor tendons, right hand: Secondary | ICD-10-CM

## 2013-05-06 NOTE — Progress Notes (Signed)
   Subjective:    Patient ID: Jeff Hoffman, male    DOB: 06/29/1970, 43 y.o.   MRN: [id number]  HPI Approximately one week ago while pulling computer bag from his car he felt a popping sensation in the third MCP joint. It causes some pain and medial swelling dorsally. A few days later he felt it pop again. He is still having some residual pain. He did demonstrate pulling the bag out of his car with the strap between his index and large finger.  Review of Systems     Objective:   Physical Exam Full motion of the third and fourth fingers and MCP joint. Resisted flexion and extension was slightly uncomfortable especially with full extension over the MCP joint. No effusion noted.       Assessment & Plan:  Nontraumatic subluxation of extensor tendon at metacarpophalangeal joint of right hand  I reassured him that what he had was a subluxed tendon and he is now back in place. Did instruct him on limiting flexion and extension.

## 2013-05-13 ENCOUNTER — Ambulatory Visit (INDEPENDENT_AMBULATORY_CARE_PROVIDER_SITE_OTHER): Payer: BC Managed Care – PPO | Admitting: Family Medicine

## 2013-05-13 ENCOUNTER — Encounter: Payer: Self-pay | Admitting: Family Medicine

## 2013-05-13 VITALS — BP 132/88 | HR 68 | Wt 210.0 lb

## 2013-05-13 DIAGNOSIS — R21 Rash and other nonspecific skin eruption: Secondary | ICD-10-CM

## 2013-05-13 MED ORDER — TRIAMCINOLONE ACETONIDE 0.5 % EX CREA: 1.0000 "application " | TOPICAL_CREAM | Freq: Three times a day (TID) | CUTANEOUS | Status: DC

## 2013-05-13 NOTE — Progress Notes (Signed)
   Subjective:    Patient ID: Jeff Hoffman, male    DOB: 06/13/1970, 43 y.o.   MRN: [id number]  HPI He is here for evaluation of a rash in his right axilla. He had difficulty with that in both sides but was [facility] That record was reviewed. The left axilla cleared however he has had intermittent difficulty with a rash on the right.   Review of Systems     Objective:   Physical Exam Linear slightly erythematous lesion with questionable papule present culture was taken.      Assessment & Plan:  Rash and nonspecific skin eruption - Plan: Aerobic Culture  I will await the culture results but did recommend he use Lamisil AF which is similar to the previous antifungal he was given by [facility].

## 2013-05-13 NOTE — Patient Instructions (Signed)
Try Lamisil for your armpit twice per day sparingly

## 2013-05-16 NOTE — Addendum Note (Signed)
Addended by: Debbrah AlarSMITH, Kara Mierzejewski F on: 05/16/2013 03:21 PM   Modules accepted: Orders

## 2013-05-16 NOTE — Addendum Note (Signed)
Addended by: Debbrah AlarSMITH, Johana Hopkinson F on: 05/16/2013 03:27 PM   Modules accepted: Orders

## 2013-05-18 ENCOUNTER — Encounter: Payer: Self-pay | Admitting: Family Medicine

## 2013-05-18 MED ORDER — DOXYCYCLINE HYCLATE 100 MG PO TABS: 100.0000 mg | ORAL_TABLET | Freq: Two times a day (BID) | ORAL | Status: DC

## 2013-05-18 NOTE — Addendum Note (Signed)
Addended by: Ronnald NianLALONDE, Reene Harlacher C on: 05/18/2013 09:09 PM   Modules accepted: Orders

## 2013-05-18 NOTE — Progress Notes (Signed)
   Subjective:    Patient ID: Jeff Hoffman, male    DOB: 06/24/1970, 43 y.o.   MRN: [id number]  HPI    Review of Systems     Objective:   Physical Exam        Assessment & Plan:  The culture showed staph aureus. I will place him on doxycycline.

## 2013-05-19 ENCOUNTER — Telehealth: Payer: Self-pay | Admitting: Family Medicine

## 2013-05-19 LAB — WOUND CULTURE
GRAM STAIN: NONE SEEN
Gram Stain: NONE SEEN

## 2013-05-19 NOTE — Telephone Encounter (Signed)
lm

## 2013-05-23 ENCOUNTER — Other Ambulatory Visit: Payer: Self-pay | Admitting: Family Medicine

## 2013-08-09 ENCOUNTER — Telehealth: Payer: Self-pay | Admitting: Family Medicine

## 2013-08-09 DIAGNOSIS — Z209 Contact with and (suspected) exposure to unspecified communicable disease: Secondary | ICD-10-CM

## 2013-08-09 NOTE — Telephone Encounter (Signed)
Received a email from pt requesting that he stop by for a HIV test. He does not have orders in his chart. Please advise.

## 2013-08-09 NOTE — Telephone Encounter (Signed)
I put a future order in. 

## 2013-08-11 ENCOUNTER — Other Ambulatory Visit: Payer: BC Managed Care – PPO

## 2013-08-11 DIAGNOSIS — Z209 Contact with and (suspected) exposure to unspecified communicable disease: Secondary | ICD-10-CM

## 2013-08-12 LAB — HIV ANTIBODY (ROUTINE TESTING W REFLEX): HIV 1&2 Ab, 4th Generation: NONREACTIVE

## 2013-09-22 ENCOUNTER — Ambulatory Visit (INDEPENDENT_AMBULATORY_CARE_PROVIDER_SITE_OTHER): Payer: BC Managed Care – PPO | Admitting: Family Medicine

## 2013-09-22 VITALS — BP 116/70 | HR 71 | Wt 210.0 lb

## 2013-09-22 DIAGNOSIS — J209 Acute bronchitis, unspecified: Secondary | ICD-10-CM

## 2013-09-22 DIAGNOSIS — Z209 Contact with and (suspected) exposure to unspecified communicable disease: Secondary | ICD-10-CM

## 2013-09-22 DIAGNOSIS — H109 Unspecified conjunctivitis: Secondary | ICD-10-CM

## 2013-09-22 LAB — RPR

## 2013-09-22 MED ORDER — AMOXICILLIN 875 MG PO TABS
875.0000 mg | ORAL_TABLET | Freq: Two times a day (BID) | ORAL | Status: DC
Start: 2013-09-22 — End: 2013-10-24

## 2013-09-22 NOTE — Progress Notes (Signed)
   Subjective:    Patient ID: Jeff Hoffman, male    DOB: 1970-10-19, 43 y.o.   MRN: [id number]  HPI Approximately 8 days ago he started with sore throat, nasal congestion, runny nose. Approximately 3 or 4 days ago he developed redness of the eye as well as a cough that has become productive. He was seen and placed on eyedrops for conjunctivitis however is here today for continued difficulty of cough and congestion. No fever, chills, earache. He has also had recent unprotected STD exposure. Presently he is on PREP.   Review of Systems     Objective:   Physical Exam alert and in no distress. Tympanic membranes and canals; both conjunctiva are injected. Throat is clear. Tonsils are normal. Neck is supple without adenopathy or thyromegaly. Cardiac exam shows a regular sinus rhythm without murmurs or gallops. Lungs show scattered rhonchi.        Assessment & Plan:  Contact with or exposure to unspecified communicable disease - Plan: HIV antibody, RPR  Bilateral conjunctivitis - Plan: amoxicillin (AMOXIL) 875 MG tablet  Acute bronchitis, unspecified organism - Plan: amoxicillin (AMOXIL) 875 MG tablet  he can continue to use he had just if he would like. Again cautioned him to be cautious with sexual activity and still use protection.

## 2013-09-23 ENCOUNTER — Encounter: Payer: Self-pay | Admitting: Family Medicine

## 2013-09-23 LAB — HIV ANTIBODY (ROUTINE TESTING W REFLEX): HIV: NONREACTIVE

## 2013-09-26 ENCOUNTER — Ambulatory Visit (INDEPENDENT_AMBULATORY_CARE_PROVIDER_SITE_OTHER): Payer: BC Managed Care – PPO | Admitting: Family Medicine

## 2013-09-26 ENCOUNTER — Encounter: Payer: Self-pay | Admitting: Family Medicine

## 2013-09-26 VITALS — BP 130/70 | HR 56

## 2013-09-26 DIAGNOSIS — Z209 Contact with and (suspected) exposure to unspecified communicable disease: Secondary | ICD-10-CM

## 2013-09-26 NOTE — Telephone Encounter (Signed)
Pt called and stated that he is having symptoms of gonorrhea. He would like either medication or would like medication called into his pharmacy. Pharmacy is [facility]. Pt can be reached [phone].

## 2013-09-26 NOTE — Progress Notes (Signed)
   Subjective:    Patient ID: Jeff Hoffman, male    DOB: 09-11-1970, 43 y.o.   MRN: [id number]  HPI Since last being seen he has noted some slight anal discharge but no fever, chills, sore throat. This was after sexual activity.   Review of Systems     Objective:   Physical Exam Alert and in no distress. Culture of the throat and the anus was done.       Assessment & Plan:  Contact with or exposure to unspecified communicable disease - Plan: HIV 1 RNA quant-no reflex-bld, GC/CT Probe, Amp (Throat), CANCELED: GC/chlamydia probe amp, genital  I also ordered viral load for HIV to be safe.

## 2013-09-27 ENCOUNTER — Ambulatory Visit: Payer: BC Managed Care – PPO | Admitting: Family Medicine

## 2013-09-27 LAB — HIV-1 RNA QUANT-NO REFLEX-BLD
HIV 1 RNA Quant: <20 copies/mL (ref <20)
HIV-1 RNA Quant, Log: <1.3 {Log} (ref <1.30)

## 2013-09-28 ENCOUNTER — Telehealth: Payer: Self-pay | Admitting: Family Medicine

## 2013-09-28 NOTE — Telephone Encounter (Signed)
Pt called and requested his lab results. I informed him they are not ready. Pt states that he now has blood in his stool and it is increasing. He has concerns her needs a different med. Please call pt. His phone number is [phone].

## 2013-09-29 ENCOUNTER — Other Ambulatory Visit (INDEPENDENT_AMBULATORY_CARE_PROVIDER_SITE_OTHER): Payer: BC Managed Care – PPO

## 2013-09-29 DIAGNOSIS — Z23 Encounter for immunization: Secondary | ICD-10-CM

## 2013-09-29 DIAGNOSIS — Z209 Contact with and (suspected) exposure to unspecified communicable disease: Secondary | ICD-10-CM

## 2013-09-29 MED ORDER — CEFTRIAXONE SODIUM 250 MG IJ SOLR
250.0000 mg | Freq: Once | INTRAMUSCULAR | Status: AC
  Administered 2013-09-29: 250 mg via INTRAMUSCULAR

## 2013-09-29 MED ORDER — AZITHROMYCIN 500 MG PO TABS: ORAL_TABLET | ORAL | Status: DC

## 2013-09-29 NOTE — Addendum Note (Signed)
Addended by: Herminio Commons A on: 09/29/2013 03:49 PM   Modules accepted: Orders

## 2013-10-04 LAB — GC/CHLAMYDIA PROBE, AMP (THROAT)

## 2013-10-24 ENCOUNTER — Ambulatory Visit (INDEPENDENT_AMBULATORY_CARE_PROVIDER_SITE_OTHER): Payer: BC Managed Care – PPO | Admitting: Family Medicine

## 2013-10-24 ENCOUNTER — Encounter: Payer: Self-pay | Admitting: Family Medicine

## 2013-10-24 VITALS — BP 110/70 | HR 55 | Wt 209.0 lb

## 2013-10-24 DIAGNOSIS — M545 Low back pain: Secondary | ICD-10-CM

## 2013-10-24 DIAGNOSIS — Z209 Contact with and (suspected) exposure to unspecified communicable disease: Secondary | ICD-10-CM
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
## 2013-10-25 LAB — HIV ANTIBODY (ROUTINE TESTING W REFLEX): HIV 1&2 Ab, 4th Generation: NONREACTIVE

## 2013-12-19 ENCOUNTER — Other Ambulatory Visit: Payer: Self-pay | Admitting: Family Medicine

## 2014-01-18 ENCOUNTER — Other Ambulatory Visit: Payer: Self-pay | Admitting: Family Medicine

## 2014-01-23 ENCOUNTER — Other Ambulatory Visit: Payer: BLUE CROSS/BLUE SHIELD

## 2014-01-23 ENCOUNTER — Telehealth: Payer: Self-pay | Admitting: Internal Medicine

## 2014-01-23 DIAGNOSIS — Z209 Contact with and (suspected) exposure to unspecified communicable disease: Secondary | ICD-10-CM

## 2014-01-23 NOTE — Telephone Encounter (Signed)
I placed an order. He can just come in for the blood work

## 2014-01-23 NOTE — Telephone Encounter (Signed)
Pt called wanting to come in for rountine HIV check. Does he need to see you or can he just get blood work done. If ok to do blood work please put in orders so i can call pt back to let him know

## 2014-01-23 NOTE — Telephone Encounter (Signed)
Pt coming in today

## 2014-01-24 LAB — RPR

## 2014-01-24 LAB — HIV ANTIBODY (ROUTINE TESTING W REFLEX): HIV 1&2 Ab, 4th Generation: NONREACTIVE

## 2014-03-09 ENCOUNTER — Other Ambulatory Visit: Payer: Self-pay | Admitting: Family Medicine

## 2014-03-09 NOTE — Telephone Encounter (Signed)
Is this okay?

## 2014-03-10 ENCOUNTER — Telehealth: Payer: Self-pay | Admitting: Family Medicine

## 2014-03-10 NOTE — Telephone Encounter (Signed)
Pt states can't afford Qvar at this time.  Asked for samples.  [facility]

## 2014-03-13 ENCOUNTER — Ambulatory Visit (INDEPENDENT_AMBULATORY_CARE_PROVIDER_SITE_OTHER): Payer: BLUE CROSS/BLUE SHIELD | Admitting: Family Medicine

## 2014-03-13 ENCOUNTER — Encounter: Payer: Self-pay | Admitting: Family Medicine

## 2014-03-13 VITALS — BP 124/72 | HR 72 | Temp 98.5°F | Wt 203.0 lb

## 2014-03-13 DIAGNOSIS — J4520 Mild intermittent asthma, uncomplicated: Secondary | ICD-10-CM

## 2014-03-13 DIAGNOSIS — J019 Acute sinusitis, unspecified: Secondary | ICD-10-CM

## 2014-03-13 DIAGNOSIS — J209 Acute bronchitis, unspecified: Secondary | ICD-10-CM

## 2014-03-13 MED ORDER — CLARITHROMYCIN 500 MG PO TABS: 500.0000 mg | ORAL_TABLET | Freq: Two times a day (BID) | ORAL | Status: DC

## 2014-03-13 NOTE — Progress Notes (Signed)
   Subjective:    Patient ID: Jeff Hoffman, male    DOB: 12/20/1970, 44 y.o.   MRN: [id number]  HPI He complains of a two-month history of intermittent difficulty with nasal and chest congestion with occasional productive cough and PND. He is also had fever but no chills. He has had one episode of retro-orbital pain as well as headache.He also complains of intermittent difficulty with an irregular heartbeat. He says it can last just for several seconds and then will go away. He has not kept a record of his pulse during this timeframe. Does have underlying allergies as well as occasional difficulty with asthma. He continues on medications listed in the chart.   Review of Systems     Objective:   Physical Exam Alert and in no distress. Tympanic membranes and canals are normal. Pharyngeal area is normal. Neck is supple without adenopathy or thyromegaly. Cardiac exam shows a regular sinus rhythm without murmurs or gallops. Lungs show occasional rhonchi on the right. His mucosa is slightly red. She will tenderness over his frontal sinus.        Assessment & Plan:  Acute bronchitis, unspecified organism - Plan: clarithromycin (BIAXIN) 500 MG tablet  Acute sinusitis, recurrence not specified, unspecified location - Plan: clarithromycin (BIAXIN) 500 MG tablet  Asthma, chronic, mild intermittent, uncomplicated  he will continue on his other medications. He is to keep track of his pulse rate when he does have the irregular heartbeat to see if there is a pattern and then let me know.

## 2014-03-13 NOTE — Patient Instructions (Signed)
Titer take your pulse the next time this occurs and then call me. How fast visit and is a regular or irregular

## 2014-04-20 ENCOUNTER — Other Ambulatory Visit: Payer: Self-pay | Admitting: Family Medicine

## 2014-05-04 ENCOUNTER — Encounter (HOSPITAL_COMMUNITY): Payer: Self-pay

## 2014-05-04 ENCOUNTER — Inpatient Hospital Stay (HOSPITAL_COMMUNITY)
Admission: EM | Admit: 2014-05-04 | Discharge: 2014-05-10 | DRG: 339 | Disposition: A | Discharge status: Home / Self Care | Payer: BLUE CROSS/BLUE SHIELD | Attending: General Surgery | Admitting: General Surgery

## 2014-05-04 DIAGNOSIS — Z87891 Personal history of nicotine dependence: Secondary | ICD-10-CM

## 2014-05-04 DIAGNOSIS — K567 Ileus, unspecified: Secondary | ICD-10-CM | POA: Diagnosis not present

## 2014-05-04 DIAGNOSIS — K352 Acute appendicitis with generalized peritonitis: Secondary | ICD-10-CM | POA: Diagnosis not present

## 2014-05-04 DIAGNOSIS — B20 Human immunodeficiency virus [HIV] disease: Secondary | ICD-10-CM

## 2014-05-04 DIAGNOSIS — K3580 Unspecified acute appendicitis: Secondary | ICD-10-CM

## 2014-05-04 DIAGNOSIS — Z79899 Other long term (current) drug therapy: Secondary | ICD-10-CM

## 2014-05-04 DIAGNOSIS — R1031 Right lower quadrant pain: Secondary | ICD-10-CM | POA: Diagnosis not present

## 2014-05-04 LAB — CBC WITH DIFFERENTIAL/PLATELET
BASOS PCT: 0 % (ref 0–1)
Basophils Absolute: 0 10*3/uL (ref 0.0–0.1)
EOS ABS: 0 10*3/uL (ref 0.0–0.7)
Eosinophils Relative: 0 % (ref 0–5)
HEMATOCRIT: 41.2 % (ref 39.0–52.0)
HEMOGLOBIN: 13.9 g/dL (ref 13.0–17.0)
Lymphocytes Relative: 12 % (ref 12–46)
Lymphs Abs: 1.1 10*3/uL (ref 0.7–4.0)
MCH: 29.3 pg (ref 26.0–34.0)
MCHC: 33.7 g/dL (ref 30.0–36.0)
MCV: 86.9 fL (ref 78.0–100.0)
MONOS PCT: 9 % (ref 3–12)
Monocytes Absolute: 0.8 10*3/uL (ref 0.1–1.0)
Neutro Abs: 7 10*3/uL (ref 1.7–7.7)
Neutrophils Relative %: 79 % — ABNORMAL HIGH (ref 43–77)
Platelets: 176 10*3/uL (ref 150–400)
RBC: 4.74 MIL/uL (ref 4.22–5.81)
RDW: 12.8 % (ref 11.5–15.5)
WBC: 9 10*3/uL (ref 4.0–10.5)

## 2014-05-04 LAB — COMPREHENSIVE METABOLIC PANEL
ALT: 16 U/L (ref 0–53)
AST: 17 U/L (ref 0–37)
Albumin: 4.5 g/dL (ref 3.5–5.2)
Alkaline Phosphatase: 75 U/L (ref 39–117)
Anion gap: 10 (ref 5–15)
BILIRUBIN TOTAL: 0.8 mg/dL (ref 0.3–1.2)
BUN: 24 mg/dL — ABNORMAL HIGH (ref 6–23)
CHLORIDE: 102 mmol/L (ref 96–112)
CO2: 25 mmol/L (ref 19–32)
CREATININE: 0.84 mg/dL (ref 0.50–1.35)
Calcium: 9.1 mg/dL (ref 8.4–10.5)
GFR calc Af Amer: >90 mL/min (ref >90)
Glucose, Bld: 123 mg/dL — ABNORMAL HIGH (ref 70–99)
Potassium: 3.7 mmol/L (ref 3.5–5.1)
Sodium: 137 mmol/L (ref 135–145)
Total Protein: 7.1 g/dL (ref 6.0–8.3)

## 2014-05-04 NOTE — ED Notes (Signed)
Pt presents with c/o abdominal pain that started last night. Pt reports he feels very bloated but it is not tender to touch. Pt reports he had unprotected anal sex on Saturday and is also having some mucosal discharge from his rectum. Pt believes he may be impacted and also believes he may have torn a muscle around his right hip that gets worse as he continues to feel more bloated.

## 2014-05-05 ENCOUNTER — Inpatient Hospital Stay (HOSPITAL_COMMUNITY): Payer: BLUE CROSS/BLUE SHIELD | Admitting: Anesthesiology

## 2014-05-05 ENCOUNTER — Ambulatory Visit: Payer: BLUE CROSS/BLUE SHIELD | Admitting: Medical

## 2014-05-05 ENCOUNTER — Emergency Department (HOSPITAL_COMMUNITY): Payer: BLUE CROSS/BLUE SHIELD

## 2014-05-05 ENCOUNTER — Encounter (HOSPITAL_COMMUNITY): Payer: Self-pay

## 2014-05-05 ENCOUNTER — Encounter (HOSPITAL_COMMUNITY)
Admission: EM | Disposition: A | Discharge status: Home / Self Care | Payer: BLUE CROSS/BLUE SHIELD | Source: Home / Self Care

## 2014-05-05 DIAGNOSIS — K3580 Unspecified acute appendicitis: Secondary | ICD-10-CM | POA: Diagnosis present

## 2014-05-05 DIAGNOSIS — R1031 Right lower quadrant pain: Secondary | ICD-10-CM | POA: Diagnosis present

## 2014-05-05 DIAGNOSIS — K352 Acute appendicitis with generalized peritonitis: Secondary | ICD-10-CM | POA: Diagnosis present

## 2014-05-05 DIAGNOSIS — K567 Ileus, unspecified: Secondary | ICD-10-CM | POA: Diagnosis not present

## 2014-05-05 DIAGNOSIS — Z79899 Other long term (current) drug therapy: Secondary | ICD-10-CM | POA: Diagnosis not present

## 2014-05-05 DIAGNOSIS — Z87891 Personal history of nicotine dependence: Secondary | ICD-10-CM | POA: Diagnosis not present

## 2014-05-05 HISTORY — PX: LAPAROSCOPIC APPENDECTOMY: SHX408

## 2014-05-05 LAB — URINALYSIS, ROUTINE W REFLEX MICROSCOPIC
Bilirubin Urine: NEGATIVE
Glucose, UA: NEGATIVE mg/dL
HGB URINE DIPSTICK: NEGATIVE
Ketones, ur: NEGATIVE mg/dL
Leukocytes, UA: NEGATIVE
NITRITE: NEGATIVE
PH: 6 (ref 5.0–8.0)
PROTEIN: NEGATIVE mg/dL
Specific Gravity, Urine: 1.029 (ref 1.005–1.030)
Urobilinogen, UA: 0.2 mg/dL (ref 0.0–1.0)

## 2014-05-05 LAB — SURGICAL PCR SCREEN
MRSA, PCR: NEGATIVE
Staphylococcus aureus: NEGATIVE

## 2014-05-05 SURGERY — APPENDECTOMY, LAPAROSCOPIC
Anesthesia: General | Site: Abdomen

## 2014-05-05 MED ORDER — NAPROXEN 500 MG PO TABS: 500.0000 mg | ORAL_TABLET | Freq: Two times a day (BID) | ORAL | Status: DC | PRN

## 2014-05-05 MED ORDER — NAPROXEN 375 MG PO TABS
375.0000 mg | ORAL_TABLET | Freq: Two times a day (BID) | ORAL | Status: DC | PRN
  Administered 2014-05-05 – 2014-05-10 (×3): 375 mg via ORAL
  Filled 2014-05-05 (×7): qty 1

## 2014-05-05 MED ORDER — FENTANYL CITRATE (PF) 100 MCG/2ML IJ SOLN: 50.0000 ug | INTRAMUSCULAR | Status: DC

## 2014-05-05 MED ORDER — GLYCOPYRROLATE 0.2 MG/ML IJ SOLN
INTRAMUSCULAR | Status: DC | PRN
  Administered 2014-05-05: 0.6 mg via INTRAVENOUS

## 2014-05-05 MED ORDER — PROPOFOL 10 MG/ML IV BOLUS
INTRAVENOUS | Status: AC
  Filled 2014-05-05: qty 20

## 2014-05-05 MED ORDER — LACTATED RINGERS IV BOLUS (SEPSIS): 1000.0000 mL | Freq: Three times a day (TID) | INTRAVENOUS | Status: DC | PRN

## 2014-05-05 MED ORDER — LORATADINE 10 MG PO TABS
10.0000 mg | ORAL_TABLET | Freq: Every day | ORAL | Status: DC
  Filled 2014-05-05: qty 1

## 2014-05-05 MED ORDER — ONDANSETRON HCL 4 MG/2ML IJ SOLN
INTRAMUSCULAR | Status: AC
  Filled 2014-05-05: qty 2

## 2014-05-05 MED ORDER — BUPIVACAINE HCL (PF) 0.5 % IJ SOLN
INTRAMUSCULAR | Status: DC | PRN
Start: 2014-05-05 — End: 2014-05-05
  Administered 2014-05-05: 5 mL

## 2014-05-05 MED ORDER — DEXAMETHASONE SODIUM PHOSPHATE 10 MG/ML IJ SOLN
INTRAMUSCULAR | Status: AC
  Filled 2014-05-05: qty 1

## 2014-05-05 MED ORDER — ONDANSETRON HCL 4 MG/2ML IJ SOLN
4.0000 mg | Freq: Four times a day (QID) | INTRAMUSCULAR | Status: DC | PRN
  Administered 2014-05-05: 4 mg via INTRAVENOUS
  Filled 2014-05-05: qty 2

## 2014-05-05 MED ORDER — CHLORHEXIDINE GLUCONATE 4 % EX LIQD
1.0000 "application " | Freq: Once | CUTANEOUS | Status: DC
  Filled 2014-05-05: qty 15

## 2014-05-05 MED ORDER — FENTANYL CITRATE (PF) 250 MCG/5ML IJ SOLN
INTRAMUSCULAR | Status: AC
  Filled 2014-05-05: qty 5

## 2014-05-05 MED ORDER — ONDANSETRON HCL 4 MG PO TABS: 4.0000 mg | ORAL_TABLET | Freq: Four times a day (QID) | ORAL | Status: DC | PRN

## 2014-05-05 MED ORDER — SODIUM CHLORIDE 0.9 % IV SOLN
8.0000 mg | Freq: Four times a day (QID) | INTRAVENOUS | Status: DC | PRN
  Filled 2014-05-05: qty 4

## 2014-05-05 MED ORDER — PROPOFOL 10 MG/ML IV BOLUS
INTRAVENOUS | Status: DC | PRN
  Administered 2014-05-05: 200 mg via INTRAVENOUS

## 2014-05-05 MED ORDER — MEPERIDINE HCL 50 MG/ML IJ SOLN: 6.2500 mg | INTRAMUSCULAR | Status: DC | PRN

## 2014-05-05 MED ORDER — SUCCINYLCHOLINE CHLORIDE 20 MG/ML IJ SOLN
INTRAMUSCULAR | Status: DC | PRN
  Administered 2014-05-05: 100 mg via INTRAVENOUS

## 2014-05-05 MED ORDER — ROCURONIUM BROMIDE 100 MG/10ML IV SOLN
INTRAVENOUS | Status: DC | PRN
  Administered 2014-05-05: 40 mg via INTRAVENOUS
  Administered 2014-05-05: 10 mg via INTRAVENOUS

## 2014-05-05 MED ORDER — FENTANYL CITRATE (PF) 100 MCG/2ML IJ SOLN
50.0000 ug | INTRAMUSCULAR | Status: DC | PRN
  Administered 2014-05-05 (×3): 50 ug via INTRAVENOUS

## 2014-05-05 MED ORDER — BUPIVACAINE HCL (PF) 0.5 % IJ SOLN
INTRAMUSCULAR | Status: AC
  Filled 2014-05-05: qty 30

## 2014-05-05 MED ORDER — ACETAMINOPHEN 325 MG PO TABS
325.0000 mg | ORAL_TABLET | Freq: Four times a day (QID) | ORAL | Status: DC | PRN
  Administered 2014-05-05: 650 mg via ORAL
  Filled 2014-05-05: qty 2

## 2014-05-05 MED ORDER — PANTOPRAZOLE SODIUM 40 MG PO TBEC: 40.0000 mg | DELAYED_RELEASE_TABLET | Freq: Every day | ORAL | Status: DC

## 2014-05-05 MED ORDER — ROCURONIUM BROMIDE 100 MG/10ML IV SOLN
INTRAVENOUS | Status: AC
  Filled 2014-05-05: qty 1

## 2014-05-05 MED ORDER — PANTOPRAZOLE SODIUM 40 MG IV SOLR
40.0000 mg | INTRAVENOUS | Status: DC
  Administered 2014-05-05 – 2014-05-09 (×5): 40 mg via INTRAVENOUS
  Filled 2014-05-05 (×6): qty 40

## 2014-05-05 MED ORDER — MONTELUKAST SODIUM 10 MG PO TABS
10.0000 mg | ORAL_TABLET | Freq: Every day | ORAL | Status: DC
  Filled 2014-05-05: qty 1

## 2014-05-05 MED ORDER — FENTANYL CITRATE (PF) 100 MCG/2ML IJ SOLN
INTRAMUSCULAR | Status: AC
  Filled 2014-05-05: qty 2

## 2014-05-05 MED ORDER — DEXTROSE 5 % IV SOLN
2.0000 g | Freq: Every day | INTRAVENOUS | Status: DC
  Administered 2014-05-05: 2 g via INTRAVENOUS
  Filled 2014-05-05 (×2): qty 2

## 2014-05-05 MED ORDER — AZELASTINE HCL 0.1 % NA SOLN
1.0000 | Freq: Two times a day (BID) | NASAL | Status: DC
  Administered 2014-05-06 – 2014-05-10 (×8): 1 via NASAL
  Filled 2014-05-05: qty 30

## 2014-05-05 MED ORDER — BISMUTH SUBSALICYLATE 262 MG/15ML PO SUSP
30.0000 mL | Freq: Four times a day (QID) | ORAL | Status: DC | PRN
  Filled 2014-05-05: qty 118

## 2014-05-05 MED ORDER — LACTATED RINGERS IV SOLN
INTRAVENOUS | Status: DC | PRN
  Administered 2014-05-05: 4000 mL

## 2014-05-05 MED ORDER — ONDANSETRON HCL 4 MG/2ML IJ SOLN
INTRAMUSCULAR | Status: DC | PRN
  Administered 2014-05-05: 4 mg via INTRAVENOUS

## 2014-05-05 MED ORDER — FLUTICASONE PROPIONATE 50 MCG/ACT NA SUSP
2.0000 | Freq: Every day | NASAL | Status: DC
  Administered 2014-05-07 – 2014-05-10 (×4): 2 via NASAL
  Filled 2014-05-05: qty 16

## 2014-05-05 MED ORDER — METRONIDAZOLE IN NACL 5-0.79 MG/ML-% IV SOLN
500.0000 mg | Freq: Once | INTRAVENOUS | Status: DC
Start: 2014-05-05 — End: 2014-05-05

## 2014-05-05 MED ORDER — GLYCOPYRROLATE 0.2 MG/ML IJ SOLN
INTRAMUSCULAR | Status: AC
  Filled 2014-05-05: qty 3

## 2014-05-05 MED ORDER — DIPHENHYDRAMINE HCL 50 MG/ML IJ SOLN: 12.5000 mg | Freq: Four times a day (QID) | INTRAMUSCULAR | Status: DC | PRN

## 2014-05-05 MED ORDER — MIDAZOLAM HCL 5 MG/5ML IJ SOLN
INTRAMUSCULAR | Status: DC | PRN
  Administered 2014-05-05: 2 mg via INTRAVENOUS

## 2014-05-05 MED ORDER — MORPHINE SULFATE 2 MG/ML IJ SOLN
2.0000 mg | INTRAMUSCULAR | Status: DC | PRN
  Administered 2014-05-05: 6 mg via INTRAVENOUS
  Administered 2014-05-05 (×2): 2 mg via INTRAVENOUS
  Administered 2014-05-06 (×2): 4 mg via INTRAVENOUS
  Administered 2014-05-06 (×2): 6 mg via INTRAVENOUS
  Administered 2014-05-06 (×2): 4 mg via INTRAVENOUS
  Administered 2014-05-07: 2 mg via INTRAVENOUS
  Administered 2014-05-07 – 2014-05-09 (×7): 4 mg via INTRAVENOUS
  Filled 2014-05-05 (×2): qty 3
  Filled 2014-05-05 (×2): qty 2
  Filled 2014-05-05: qty 1
  Filled 2014-05-05 (×8): qty 2
  Filled 2014-05-05: qty 3
  Filled 2014-05-05 (×2): qty 2
  Filled 2014-05-05: qty 1

## 2014-05-05 MED ORDER — DEXTROSE 5 % IV SOLN: 1.0000 g | Freq: Once | INTRAVENOUS | Status: DC

## 2014-05-05 MED ORDER — FENTANYL CITRATE (PF) 100 MCG/2ML IJ SOLN
INTRAMUSCULAR | Status: DC | PRN
Start: 2014-05-05 — End: 2014-05-05
  Administered 2014-05-05 (×5): 50 ug via INTRAVENOUS

## 2014-05-05 MED ORDER — SODIUM CHLORIDE 0.9 % IV SOLN
INTRAVENOUS | Status: DC
  Administered 2014-05-05: 06:00:00 via INTRAVENOUS

## 2014-05-05 MED ORDER — PIPERACILLIN-TAZOBACTAM 3.375 G IVPB
3.3750 g | Freq: Three times a day (TID) | INTRAVENOUS | Status: DC
  Administered 2014-05-05 – 2014-05-10 (×14): 3.375 g via INTRAVENOUS
  Filled 2014-05-05 (×15): qty 50

## 2014-05-05 MED ORDER — PROMETHAZINE HCL 25 MG/ML IJ SOLN: 6.2500 mg | INTRAMUSCULAR | Status: DC | PRN

## 2014-05-05 MED ORDER — NEOSTIGMINE METHYLSULFATE 10 MG/10ML IV SOLN
INTRAVENOUS | Status: DC | PRN
  Administered 2014-05-05: 5 mg via INTRAVENOUS

## 2014-05-05 MED ORDER — HYDROMORPHONE HCL 1 MG/ML IJ SOLN
1.0000 mg | Freq: Once | INTRAMUSCULAR | Status: AC
Start: 2014-05-05 — End: 2014-05-05
  Administered 2014-05-05: 1 mg via INTRAVENOUS
  Filled 2014-05-05: qty 1

## 2014-05-05 MED ORDER — EMTRICITABINE-TENOFOVIR DF 200-300 MG PO TABS
1.0000 | ORAL_TABLET | Freq: Every day | ORAL | Status: DC
  Administered 2014-05-06 – 2014-05-10 (×5): 1 via ORAL
  Filled 2014-05-05 (×6): qty 1

## 2014-05-05 MED ORDER — KCL IN DEXTROSE-NACL 20-5-0.9 MEQ/L-%-% IV SOLN
INTRAVENOUS | Status: DC
  Administered 2014-05-05 – 2014-05-10 (×8): via INTRAVENOUS
  Filled 2014-05-05 (×17): qty 1000

## 2014-05-05 MED ORDER — LIDOCAINE HCL (CARDIAC) 20 MG/ML IV SOLN
INTRAVENOUS | Status: DC | PRN
  Administered 2014-05-05: 100 mg via INTRAVENOUS

## 2014-05-05 MED ORDER — NAPROXEN 500 MG PO TABS
500.0000 mg | ORAL_TABLET | Freq: Two times a day (BID) | ORAL | Status: DC | PRN
  Filled 2014-05-05: qty 1

## 2014-05-05 MED ORDER — SODIUM CHLORIDE 0.9 % IV SOLN
Freq: Once | INTRAVENOUS | Status: AC
  Administered 2014-05-05: 10 mL/h via INTRAVENOUS

## 2014-05-05 MED ORDER — OXYCODONE HCL 5 MG PO TABS: 5.0000 mg | ORAL_TABLET | ORAL | Status: DC | PRN

## 2014-05-05 MED ORDER — ONDANSETRON HCL 4 MG/2ML IJ SOLN
4.0000 mg | Freq: Once | INTRAMUSCULAR | Status: AC
  Administered 2014-05-05: 4 mg via INTRAVENOUS
  Filled 2014-05-05: qty 2

## 2014-05-05 MED ORDER — LACTATED RINGERS IV BOLUS (SEPSIS): 1000.0000 mL | Freq: Once | INTRAVENOUS | Status: DC

## 2014-05-05 MED ORDER — FENTANYL CITRATE (PF) 100 MCG/2ML IJ SOLN
25.0000 ug | INTRAMUSCULAR | Status: DC | PRN
  Administered 2014-05-05: 50 ug via INTRAVENOUS

## 2014-05-05 MED ORDER — ALBUTEROL SULFATE (2.5 MG/3ML) 0.083% IN NEBU: 3.0000 mL | INHALATION_SOLUTION | Freq: Four times a day (QID) | RESPIRATORY_TRACT | Status: DC | PRN

## 2014-05-05 MED ORDER — HEPARIN SODIUM (PORCINE) 5000 UNIT/ML IJ SOLN
5000.0000 [IU] | Freq: Three times a day (TID) | INTRAMUSCULAR | Status: DC
  Filled 2014-05-05: qty 1

## 2014-05-05 MED ORDER — METRONIDAZOLE IN NACL 5-0.79 MG/ML-% IV SOLN
500.0000 mg | Freq: Four times a day (QID) | INTRAVENOUS | Status: DC
  Administered 2014-05-05: 500 mg via INTRAVENOUS
  Filled 2014-05-05 (×3): qty 100

## 2014-05-05 MED ORDER — LACTATED RINGERS IV SOLN
INTRAVENOUS | Status: DC
  Administered 2014-05-05: 1000 mL via INTRAVENOUS
  Administered 2014-05-05: 13:00:00 via INTRAVENOUS

## 2014-05-05 MED ORDER — FENTANYL CITRATE (PF) 100 MCG/2ML IJ SOLN
50.0000 ug | Freq: Once | INTRAMUSCULAR | Status: AC
  Administered 2014-05-05: 50 ug via INTRAVENOUS

## 2014-05-05 MED ORDER — LIDOCAINE HCL (CARDIAC) 20 MG/ML IV SOLN
INTRAVENOUS | Status: AC
  Filled 2014-05-05: qty 5

## 2014-05-05 MED ORDER — LIP MEDEX EX OINT
1.0000 "application " | TOPICAL_OINTMENT | Freq: Two times a day (BID) | CUTANEOUS | Status: DC
  Administered 2014-05-05 – 2014-05-10 (×11): 1 via TOPICAL
  Filled 2014-05-05: qty 7

## 2014-05-05 MED ORDER — DEXAMETHASONE SODIUM PHOSPHATE 10 MG/ML IJ SOLN
INTRAMUSCULAR | Status: DC | PRN
  Administered 2014-05-05: 10 mg via INTRAVENOUS

## 2014-05-05 MED ORDER — HEPARIN SODIUM (PORCINE) 5000 UNIT/ML IJ SOLN
5000.0000 [IU] | Freq: Three times a day (TID) | INTRAMUSCULAR | Status: DC
  Administered 2014-05-06 – 2014-05-10 (×13): 5000 [IU] via SUBCUTANEOUS
  Filled 2014-05-05 (×16): qty 1

## 2014-05-05 MED ORDER — ACETAMINOPHEN 650 MG RE SUPP: 650.0000 mg | Freq: Four times a day (QID) | RECTAL | Status: DC | PRN

## 2014-05-05 MED ORDER — PHENOL 1.4 % MT LIQD
2.0000 | OROMUCOSAL | Status: DC | PRN
  Filled 2014-05-05: qty 177

## 2014-05-05 MED ORDER — ALUM & MAG HYDROXIDE-SIMETH 200-200-20 MG/5ML PO SUSP: 30.0000 mL | Freq: Four times a day (QID) | ORAL | Status: DC | PRN

## 2014-05-05 MED ORDER — MENTHOL 3 MG MT LOZG
1.0000 | LOZENGE | OROMUCOSAL | Status: DC | PRN
  Filled 2014-05-05: qty 9

## 2014-05-05 MED ORDER — NEOSTIGMINE METHYLSULFATE 10 MG/10ML IV SOLN
INTRAVENOUS | Status: AC
  Filled 2014-05-05: qty 1

## 2014-05-05 MED ORDER — IOHEXOL 300 MG/ML  SOLN
100.0000 mL | Freq: Once | INTRAMUSCULAR | Status: AC | PRN
  Administered 2014-05-05: 100 mL via INTRAVENOUS

## 2014-05-05 MED ORDER — LACTATED RINGERS IV SOLN: INTRAVENOUS | Status: DC

## 2014-05-05 MED ORDER — MIDAZOLAM HCL 2 MG/2ML IJ SOLN
INTRAMUSCULAR | Status: AC
  Filled 2014-05-05: qty 2

## 2014-05-05 MED ORDER — HYDROMORPHONE HCL 1 MG/ML IJ SOLN
0.5000 mg | Freq: Once | INTRAMUSCULAR | Status: AC
  Administered 2014-05-05: 0.5 mg via INTRAVENOUS
  Filled 2014-05-05: qty 1

## 2014-05-05 MED ORDER — HYDROMORPHONE HCL 1 MG/ML IJ SOLN
0.5000 mg | INTRAMUSCULAR | Status: DC | PRN
  Administered 2014-05-05 (×2): 2 mg via INTRAVENOUS
  Administered 2014-05-05: 1 mg via INTRAVENOUS
  Filled 2014-05-05: qty 2
  Filled 2014-05-05: qty 1
  Filled 2014-05-05: qty 2

## 2014-05-05 MED ORDER — IOHEXOL 300 MG/ML  SOLN
50.0000 mL | Freq: Once | INTRAMUSCULAR | Status: AC | PRN
  Administered 2014-05-05: 25 mL via ORAL

## 2014-05-05 MED ORDER — PROMETHAZINE HCL 25 MG/ML IJ SOLN
6.2500 mg | INTRAMUSCULAR | Status: DC | PRN
  Filled 2014-05-05: qty 1

## 2014-05-05 MED ORDER — MAGIC MOUTHWASH
15.0000 mL | Freq: Four times a day (QID) | ORAL | Status: DC | PRN
  Filled 2014-05-05: qty 15

## 2014-05-05 SURGICAL SUPPLY — 44 items
APL SKNCLS STERI-STRIP NONHPOA (GAUZE/BANDAGES/DRESSINGS) ×1
APPLIER CLIP 5 13 M/L LIGAMAX5 (MISCELLANEOUS)
APPLIER CLIP ROT 10 11.4 M/L (STAPLE)
APR CLP MED LRG 11.4X10 (STAPLE)
APR CLP MED LRG 5 ANG JAW (MISCELLANEOUS)
BAG SPEC RTRVL LRG 6X4 10 (ENDOMECHANICALS) ×1
BENZOIN TINCTURE PRP APPL 2/3 (GAUZE/BANDAGES/DRESSINGS) ×2 IMPLANT
CHLORAPREP W/TINT 26ML (MISCELLANEOUS) ×2 IMPLANT
CLIP APPLIE 5 13 M/L LIGAMAX5 (MISCELLANEOUS) IMPLANT
CLIP APPLIE ROT 10 11.4 M/L (STAPLE) IMPLANT
CUTTER FLEX LINEAR 45M (STAPLE) ×1 IMPLANT
DECANTER SPIKE VIAL GLASS SM (MISCELLANEOUS) ×2 IMPLANT
DRAIN CHANNEL 19F RND (DRAIN) ×2 IMPLANT
DRAPE LAPAROSCOPIC ABDOMINAL (DRAPES) ×2 IMPLANT
DRSG TEGADERM 2-3/8X2-3/4 SM (GAUZE/BANDAGES/DRESSINGS) ×4 IMPLANT
ELECT REM PT RETURN 9FT ADLT (ELECTROSURGICAL) ×2
ELECTRODE REM PT RTRN 9FT ADLT (ELECTROSURGICAL) ×1 IMPLANT
ENDOLOOP SUT PDS II  0 18 (SUTURE)
ENDOLOOP SUT PDS II 0 18 (SUTURE) IMPLANT
EVACUATOR SILICONE 100CC (DRAIN) ×2 IMPLANT
GAUZE SPONGE 2X2 8PLY STRL LF (GAUZE/BANDAGES/DRESSINGS) ×1 IMPLANT
GLOVE ECLIPSE 8.0 STRL XLNG CF (GLOVE) ×2 IMPLANT
GLOVE INDICATOR 8.0 STRL GRN (GLOVE) ×2 IMPLANT
GOWN STRL REUS W/TWL XL LVL3 (GOWN DISPOSABLE) ×4 IMPLANT
KIT BASIN OR (CUSTOM PROCEDURE TRAY) ×2 IMPLANT
POUCH SPECIMEN RETRIEVAL 10MM (ENDOMECHANICALS) ×2 IMPLANT
RELOAD 45 VASCULAR/THIN (ENDOMECHANICALS) IMPLANT
RELOAD STAPLE TA45 3.5 REG BLU (ENDOMECHANICALS) ×2 IMPLANT
SCISSORS LAP 5X35 DISP (ENDOMECHANICALS) ×2 IMPLANT
SET IRRIG TUBING LAPAROSCOPIC (IRRIGATION / IRRIGATOR) IMPLANT
SHEARS HARMONIC ACE PLUS 36CM (ENDOMECHANICALS) ×2 IMPLANT
SLEEVE XCEL OPT CAN 5 100 (ENDOMECHANICALS) ×2 IMPLANT
SOLUTION ANTI FOG 6CC (MISCELLANEOUS) ×2 IMPLANT
SPONGE GAUZE 2X2 STER 10/PKG (GAUZE/BANDAGES/DRESSINGS) ×1
STRIP CLOSURE SKIN 1/2X4 (GAUZE/BANDAGES/DRESSINGS) ×2 IMPLANT
SUT ETHILON 3 0 PS 1 (SUTURE) ×2 IMPLANT
SUT MNCRL AB 4-0 PS2 18 (SUTURE) ×2 IMPLANT
TOWEL OR 17X26 10 PK STRL BLUE (TOWEL DISPOSABLE) ×2 IMPLANT
TOWEL OR NON WOVEN STRL DISP B (DISPOSABLE) ×2 IMPLANT
TRAY FOLEY W/METER SILVER 14FR (SET/KITS/TRAYS/PACK) ×2 IMPLANT
TRAY LAPAROSCOPIC (CUSTOM PROCEDURE TRAY) ×2 IMPLANT
TROCAR BLADELESS OPT 5 100 (ENDOMECHANICALS) ×2 IMPLANT
TROCAR XCEL BLUNT TIP 100MML (ENDOMECHANICALS) ×2 IMPLANT
TUBING INSUFFLATION 10FT LAP (TUBING) ×2 IMPLANT

## 2014-05-05 NOTE — Op Note (Signed)
Operative Note  CHIEF WALKUP male 44 y.o. 05/05/2014  PREOPERATIVE DX:  Acute appendicitis  POSTOPERATIVE DX:  Perforated appendicitis with feculent peritonitis  PROCEDURE:   Laparoscopic appendectomy         Surgeon: Adolph Pollack   Assistants: none  Anesthesia: General endotracheal anesthesia  Indications:   This is a 44 year old male who had 2 days of abdominal pain that radiated eventually down to the right lower quadrant. He went to the emergency department and was evaluated. CT scan demonstrated findings consistent with acute appendicitis and a small amount of free pelvic fluid. He subsequently was admitted, started on IV antibiotics, and is now brought to the operating room for appendectomy.    Procedure Detail:  He was brought to the operating room placed supine on the operating table and general anesthetic was given. A Foley catheter was inserted. An oral gastric tube was inserted. The hair on the abdominal wall was clipped. The abdominal wall was widely sterilely prepped and draped.  Marcaine was infiltrated in the subumbilical region. A small subumbilical incision was made through the skin, subcutaneous tissue, fascia, and peritoneum. The peritoneal cavity was entered under direct vision. A pursestring suture of 0 Vicryl was placed around the edges of the fascia. A Hassan trocar was introduced into the peritoneal cavity and pneumoperitoneum was created by insufflation of CO2 gas.  The laparoscope was introduced and there is no underlying bleeding or injury. I visualize right lower quadrant right midabdominal area and noted small amounts of feculent material. Also noted a inflamed appendix with an area of perforation at the midportion of the appendix.  A 5 mm trocar was placed in the left lower quadrant. A 5 mm trocar was placed in the right upper quadrant. The appendix was adherent to the lateral sidewall and I divided these attachments with the Harmonic scalpel. The  appendix took a serpiginous course after leaving the base of the cecum. Resting close to the appendix, I divided the mesoappendix and carefully mobilized the appendix. I dissected small bowel adhesions away from the appendix bluntly. I continued to careful dissection close to the appendix and to identify its interface with the cecum. At this time, the appendix was amputated using the linear cutting stapler. It was placed in a retrieval bag and removed through the subumbilical port.  On this, I inspected the staple line and there is no evidence of leak or bleeding. I copiously irrigated out the abdominal cavity with 4 L of solution. I placed a 19 Blake drain through the subumbilical port into the abdominal cavity and brought out to drain through the left lower quadrant trocar site. The drain was anchored to the skin with nylon suture and then positioned so that it was in the pelvis and heading up the right gutter.  Inspection of the abdominal cavity demonstrated no evidence of organ injury or bleeding.The subumbilical trocar was removed and the fascial defect closed by tying down the pursestring suture under laparoscopic vision. The remaining trocar was then removed and the pneumoperitoneum was released.  The skin incisions were closed with 4-0 Monocryl subcuticular stitches. Steri-Strips were applied. Drain was hooked to bulb suction. Sterile dressings were applied.  He tolerated the procedure well without any apparent complications and was taken to the recovery room in satisfactory condition. He is increased risk for postoperative infection and postoperative ileus.  Estimated Blood Loss:  200 mL         Drains: #19 Harrison Mons  Specimens: appendix        Complications:  * No complications entered in OR log *         Disposition: PACU - hemodynamically stable.         Condition: stable

## 2014-05-05 NOTE — Anesthesia Preprocedure Evaluation (Addendum)
Anesthesia Evaluation  Patient identified by MRN, date of birth, ID band Patient awake    Reviewed: Allergy & Precautions, NPO status , Patient's Chart, lab work & pertinent test results  Airway Mallampati: II  TM Distance: >3 FB Neck ROM: Full    Dental no notable dental hx.    Pulmonary neg pulmonary ROS, asthma , former smoker,  breath sounds clear to auscultation  Pulmonary exam normal       Cardiovascular negative cardio ROS  Rhythm:Regular Rate:Normal     Neuro/Psych negative neurological ROS  negative psych ROS   GI/Hepatic negative GI ROS, Neg liver ROS,   Endo/Other  negative endocrine ROS  Renal/GU negative Renal ROS  negative genitourinary   Musculoskeletal negative musculoskeletal ROS (+)   Abdominal   Peds negative pediatric ROS (+)  Hematology negative hematology ROS (+)   Anesthesia Other Findings   Reproductive/Obstetrics negative OB ROS                            Anesthesia Physical Anesthesia Plan  ASA: II and emergent  Anesthesia Plan: General   Post-op Pain Management:    Induction: Intravenous, Rapid sequence and Cricoid pressure planned  Airway Management Planned: Oral ETT  Additional Equipment:   Intra-op Plan:   Post-operative Plan: Extubation in OR  Informed Consent: I have reviewed the patients History and Physical, chart, labs and discussed the procedure including the risks, benefits and alternatives for the proposed anesthesia with the patient or authorized representative who has indicated his/her understanding and acceptance.   Dental advisory given  Plan Discussed with: CRNA  Anesthesia Plan Comments:         Anesthesia Quick Evaluation

## 2014-05-05 NOTE — Anesthesia Postprocedure Evaluation (Signed)
  Anesthesia Post-op Note  Patient: Jeff Hoffman  Procedure(s) Performed: Procedure(s): APPENDECTOMY LAPAROSCOPIC (N/A)  Patient Location: PACU  Anesthesia Type:General  Level of Consciousness: awake and alert   Airway and Oxygen Therapy: Patient Spontanous Breathing and Patient connected to nasal cannula oxygen  Post-op Pain: moderate  Post-op Assessment: Post-op Vital signs reviewed, Patient's Cardiovascular Status Stable, Respiratory Function Stable, Patent Airway and No signs of Nausea or vomiting  Post-op Vital Signs: Reviewed and stable  Last Vitals:  Filed Vitals:   05/05/14 1000  BP: 117/63  Pulse: 87  Temp: 36.6 C  Resp: 18    Complications: No apparent anesthesia complications

## 2014-05-05 NOTE — Transfer of Care (Signed)
Immediate Anesthesia Transfer of Care Note  Patient: Jeff Hoffman  Procedure(s) Performed: Procedure(s): APPENDECTOMY LAPAROSCOPIC (N/A)  Patient Location: PACU  Anesthesia Type:General  Level of Consciousness:  sedated, patient cooperative and responds to stimulation  Airway & Oxygen Therapy:Patient Spontanous Breathing and Patient connected to face mask oxgen  Post-op Assessment:  Report given to PACU RN and Post -op Vital signs reviewed and stable  Post vital signs:  Reviewed and stable  Last Vitals:  Filed Vitals:   05/05/14 1000  BP: 117/63  Pulse: 87  Temp: 36.6 C  Resp: 18    Complications: No apparent anesthesia complications

## 2014-05-05 NOTE — Anesthesia Procedure Notes (Signed)
Procedure Name: Intubation Date/Time: 05/05/2014 12:25 PM Performed by: Doran ClayALDAY, Carlee Tesfaye R Pre-anesthesia Checklist: Patient identified, Emergency Drugs available, Suction available, Patient being monitored and Timeout performed Patient Re-evaluated:Patient Re-evaluated prior to inductionOxygen Delivery Method: Circle system utilized Preoxygenation: Pre-oxygenation with 100% oxygen Intubation Type: IV induction, Rapid sequence and Cricoid Pressure applied Laryngoscope Size: Mac and 4 Grade View: Grade I Tube type: Oral Tube size: 7.5 mm Number of attempts: 1 Airway Equipment and Method: Stylet Placement Confirmation: ETT inserted through vocal cords under direct vision,  positive ETCO2 and breath sounds checked- equal and bilateral Secured at: 22 cm Tube secured with: Tape Dental Injury: Teeth and Oropharynx as per pre-operative assessment

## 2014-05-05 NOTE — H&P (Signed)
[address] [place], [place] [address] Phone: [phone] FAX: [phone]     Jeff Hoffman  [birth year] [id number]  CARE TEAM:  PCP: Wyatt Haste, MD  Outpatient Care Team: Patient Care Team: Denita Lung, MD as PCP - General  Inpatient Treatment Team: Treatment Team: Attending Provider: Rolland Porter, MD; Physician Assistant: Charlann Lange, PA-C; Registered Nurse: Loma Sender Drewery, RN; Technician: Colonel Bald, NT  This patient is a 44 y.o.male who presents today for surgical evaluation at the request of Charlann Lange, PA-C.   Reason for evaluation: Appendicitis  Active male with abdominal pain starting 2 days ago.  Mainly in central upper abdomen.  However pain has become more intense.  Worst pain now in right lower quadrant. Uncomfortable to move.  Cannot call tolerate coughing or sneezing.  Right emergency room was very painful.  He has had some nausea and decreased appetite.  No vomiting.  Normally has 1 or 2 bowels once a day.  No bad bouts of constipation or diarrhea.  No personal nor family history of GI/colon cancer, inflammatory bowel disease, irritable bowel syndrome, allergy such as Celiac Sprue, dietary/dairy problems, colitis, ulcers nor gastritis.  No recent sick contacts/gastroenteritis.  No travel outside the country.  No changes in diet.  No dysphagia to solids or liquids.  No significant heartburn or reflux.  No hematochezia, hematemesis, coffee ground emesis.  No dark urine or light colored stools.  No jaundice.  Normally no problems with eating any foods.  No evidence of prior gastric/peptic ulceration.  Walks 4 miles a day.  No history of heart or lung problems.  Because of worsening pain and nausea, his friend brought him to the emergency room.  History and physical and now CT scan suspicious for appendicitis.  Surgical consultation requested.  Past Medical History  Diagnosis Date  .  Allergy   . Asthma   . Hypogonadism male     History reviewed. No pertinent past surgical history.  History   Social History  . Marital Status: Single    Spouse Name: N/A  . Number of Children: N/A  . Years of Education: N/A   Occupational History  . Not on file.   Social History Main Topics  . Smoking status: Former Smoker    Quit date: 01/06/2001  . Smokeless tobacco: Never Used  . Alcohol Use: Not on file  . Drug Use: Not on file  . Sexual Activity: Not on file   Other Topics Concern  . Not on file   Social History Narrative    No family history on file.  Current Facility-Administered Medications  Medication Dose Route Frequency Provider Last Rate Last Dose  . cefTRIAXone (ROCEPHIN) 1 g in dextrose 5 % 50 mL IVPB  1 g Intravenous Once Charlann Lange, PA-C      . metroNIDAZOLE (FLAGYL) IVPB 500 mg  500 mg Intravenous Once Charlann Lange, PA-C       Current Outpatient Prescriptions  Medication Sig Dispense Refill  . acyclovir (ZOVIRAX) 400 MG tablet Take 400 mg by mouth 2 (two) times daily. PRN    . albuterol (PROVENTIL HFA;VENTOLIN HFA) 108 (90 BASE) MCG/ACT inhaler Inhale 2 puffs into the lungs every 6 (six) hours as needed. 1 Inhaler 0  . azelastine (ASTELIN) 0.1 % nasal spray USE 1 SPRAY IN EACH NOSTRIL TWICE A DAY. 30 mL 1  . bismuth subsalicylate (PEPTO BISMOL) 262 MG/15ML suspension Take 30 mLs by  mouth every 6 (six) hours as needed for indigestion.    . cetirizine (ZYRTEC) 10 MG tablet Take 1 tablet (10 mg total) by mouth daily. 90 tablet 3  . fluticasone (FLONASE) 50 MCG/ACT nasal spray Place 2 sprays into both nostrils daily. 16 g 11  . ibuprofen (ADVIL,MOTRIN) 200 MG tablet Take 200 mg by mouth every 6 (six) hours as needed. PT TAKES 2 AM AND 2 PM    . Magnesium 200 MG TABS Take 1 tablet by mouth daily.      . magnesium citrate SOLN Take 1 Bottle by mouth once.    . mineral oil enema Place 1 enema rectally once.    . montelukast (SINGULAIR) 10 MG tablet  TAKE 1 TABLET ONCE DAILY. 90 tablet 1  . Naproxen Sodium (ALEVE PO) Take by mouth 3 (three) times daily. PT TAKE 2 AM AND 2 PM    . omeprazole (PRILOSEC) 40 MG capsule TAKE 1 CAPSULE DAILY. 90 capsule 3  . pseudoephedrine (SUDAFED) 60 MG tablet Take 60 mg by mouth every 4 (four) hours as needed for congestion.    Marland Kitchen QVAR 80 MCG/ACT inhaler USE 1 PUFF TWICE A DAY. RINSE MOUTH. 8.7 g 5  . simethicone (MYLICON) 80 MG chewable tablet Chew 80 mg by mouth every 6 (six) hours as needed for flatulence.    . TRUVADA 200-300 MG per tablet TAKE 1 TABLET DAILY. 90 tablet 0  . clarithromycin (BIAXIN) 500 MG tablet Take 1 tablet (500 mg total) by mouth 2 (two) times daily. (Patient not taking: Reported on 05/05/2014) 28 tablet 0  . [DISCONTINUED] Testosterone 20.25 MG/ACT (1.62%) GEL Place 3 Squirts onto the skin daily.         Allergies  Allergen Reactions  . Erythromycin     Sharp stomach pains    ROS: Constitutional:  No fevers, chills, sweats.  Weight stable Eyes:  No vision changes, No discharge HENT:  No sore throats, nasal drainage Lymph: No neck swelling, No bruising easily Pulmonary:  No cough, productive sputum CV: No orthopnea, PND  Patient walks 4 miles without difficulty.  No exertional chest/neck/shoulder/arm pain. GI: No personal nor family history of GI/colon cancer, inflammatory bowel disease, irritable bowel syndrome, allergy such as Celiac Sprue, dietary/dairy problems, colitis, ulcers nor gastritis.  No recent sick contacts/gastroenteritis.  No travel outside the country.  No changes in diet. Renal: No UTIs, No hematuria Genital:  No drainage, bleeding, masses Musculoskeletal: No severe joint pain.  Good ROM major joints Skin:  No sores or lesions.  No rashes Heme/Lymph:  No easy bleeding.  No swollen lymph nodes.  Patient with concern of HIV status but testing done last year negative. Neuro: No focal weakness/numbness.  No seizures Psych: No suicidal ideation.  No  hallucinations  BP 154/71 mmHg  Pulse 93  Temp(Src) 98.1 F (36.7 C) (Oral)  Resp 18  SpO2 97%  Physical Exam: General: Pt awake/alert/oriented x4 in mild/mod acute distress Eyes: PERRL, normal EOM. Sclera nonicteric Neuro: CN II-XII intact w/o focal sensory/motor deficits. Lymph: No head/neck/groin lymphadenopathy Psych:  No delerium/psychosis/paranoia.  Mildly anxious but consolable HENT: Normocephalic, Mucus membranes moist.  No thrush Neck: Supple, No tracheal deviation Chest: No pain.  Good respiratory excursion. CV:  Pulses intact.  Regular rhythm Abdomen: Soft, Mildly distended.  Moderate upper abdominal tenderness.  Exquisite tenderness in right lower quadrant, especially at McBurney's point,  with guarding.  Reproduction of pain with percussion and bed shake.  Consistent with focal peritonitis.  No incarcerated  hernias. Ext:  SCDs BLE.  No significant edema.  No cyanosis Skin: No petechiae / purpurea.  No major sores Musculoskeletal: No severe joint pain.  Good ROM major joints   Results:   Labs: Results for orders placed or performed during the hospital encounter of 05/04/14 (from the past 48 hour(s))  CBC with Differential     Status: Abnormal   Collection Time: 05/04/14 10:41 PM  Result Value Ref Range   WBC 9.0 4.0 - 10.5 K/uL   RBC 4.74 4.22 - 5.81 MIL/uL   Hemoglobin 13.9 13.0 - 17.0 g/dL   HCT 41.2 39.0 - 52.0 %   MCV 86.9 78.0 - 100.0 fL   MCH 29.3 26.0 - 34.0 pg   MCHC 33.7 30.0 - 36.0 g/dL   RDW 12.8 11.5 - 15.5 %   Platelets 176 150 - 400 K/uL   Neutrophils Relative % 79 (H) 43 - 77 %   Neutro Abs 7.0 1.7 - 7.7 K/uL   Lymphocytes Relative 12 12 - 46 %   Lymphs Abs 1.1 0.7 - 4.0 K/uL   Monocytes Relative 9 3 - 12 %   Monocytes Absolute 0.8 0.1 - 1.0 K/uL   Eosinophils Relative 0 0 - 5 %   Eosinophils Absolute 0.0 0.0 - 0.7 K/uL   Basophils Relative 0 0 - 1 %   Basophils Absolute 0.0 0.0 - 0.1 K/uL  Comprehensive metabolic panel     Status:  Abnormal   Collection Time: 05/04/14 10:41 PM  Result Value Ref Range   Sodium 137 135 - 145 mmol/L   Potassium 3.7 3.5 - 5.1 mmol/L   Chloride 102 96 - 112 mmol/L   CO2 25 19 - 32 mmol/L   Glucose, Bld 123 (H) 70 - 99 mg/dL   BUN 24 (H) 6 - 23 mg/dL   Creatinine, Ser 0.84 0.50 - 1.35 mg/dL   Calcium 9.1 8.4 - 10.5 mg/dL   Total Protein 7.1 6.0 - 8.3 g/dL   Albumin 4.5 3.5 - 5.2 g/dL   AST 17 0 - 37 U/L   ALT 16 0 - 53 U/L   Alkaline Phosphatase 75 39 - 117 U/L   Total Bilirubin 0.8 0.3 - 1.2 mg/dL   GFR calc non Af Amer >90 >90 mL/min   GFR calc Af Amer >90 >90 mL/min    Comment: (NOTE) The eGFR has been calculated using the CKD EPI equation. This calculation has not been validated in all clinical situations. eGFR's persistently <90 mL/min signify possible Chronic Kidney Disease.    Anion gap 10 5 - 15  Urinalysis, Routine w reflex microscopic     Status: Abnormal   Collection Time: 05/05/14 12:09 AM  Result Value Ref Range   Color, Urine YELLOW YELLOW   APPearance CLOUDY (A) CLEAR   Specific Gravity, Urine 1.029 1.005 - 1.030   pH 6.0 5.0 - 8.0   Glucose, UA NEGATIVE NEGATIVE mg/dL   Hgb urine dipstick NEGATIVE NEGATIVE   Bilirubin Urine NEGATIVE NEGATIVE   Ketones, ur NEGATIVE NEGATIVE mg/dL   Protein, ur NEGATIVE NEGATIVE mg/dL   Urobilinogen, UA 0.2 0.0 - 1.0 mg/dL   Nitrite NEGATIVE NEGATIVE   Leukocytes, UA NEGATIVE NEGATIVE    Comment: MICROSCOPIC NOT DONE ON URINES WITH NEGATIVE PROTEIN, BLOOD, LEUKOCYTES, NITRITE, OR GLUCOSE <1000 mg/dL.    Imaging / Studies: Ct Abdomen Pelvis W Contrast  05/05/2014   CLINICAL DATA:  Abdominal pain and bloating, acute onset. Right hip pain and right lower quadrant  pain. Mucus discharge from rectum. Initial encounter.  EXAM: CT ABDOMEN AND PELVIS WITH CONTRAST  TECHNIQUE: Multidetector CT imaging of the abdomen and pelvis was performed using the standard protocol following bolus administration of intravenous contrast.  CONTRAST:   122m OMNIPAQUE IOHEXOL 300 MG/ML  SOLN  COMPARISON:  MRI of the lumbar spine performed 04/16/2010  FINDINGS: Mild bibasilar opacities likely reflect atelectasis.  The liver and spleen are unremarkable in appearance. The gallbladder is within normal limits. A tiny calcification at the pancreatic tail may reflect minimal chronic sequelae enthesophytes. The pancreas and adrenal glands are otherwise unremarkable.  A 1.2 cm cyst is noted at the upper pole of the left kidney. Nonspecific perinephric stranding is noted. The kidneys are otherwise unremarkable. There is no evidence of hydronephrosis. No renal or ureteral stones are seen.  The small bowel is unremarkable in appearance. The stomach is within normal limits. No acute vascular abnormalities are seen.  The appendix is dilated to 1.8 cm in maximal diameter, with an obstructing 1.3 cm appendicolith at the base of the appendix. Diffuse surrounding soft tissue inflammation and trace free fluid are seen, without definite evidence of perforation or abscess formation at this time.  The colon is largely filled with stool. There may be mild wall thickening along the rectum, though this may simply reflect decompression. Would correlate for any symptoms of proctitis. Trace free fluid within the pelvis likely arises from the patient's acute appendicitis.  The bladder is mildly distended and grossly unremarkable. The prostate remains normal in size. No inguinal lymphadenopathy is seen.  No acute osseous abnormalities are identified.  IMPRESSION: 1. Acute appendicitis, with dilatation of the appendix to 1.8 cm in maximal diameter, and an obstructing 1.3 cm appendicolith at the base of the appendix. Diffuse surrounding soft tissue inflammation and trace free fluid, without definite evidence of perforation or abscess formation at this time. The appendix wraps inferior to the cecum. 2. Question of mild wall thickening along the rectum, though this may simply reflect  decompression. Would correlate for any symptoms of proctitis. 3. Trace free fluid within the pelvis likely arises from the patient's acute appendicitis. 4. Mild bibasilar airspace opacities likely reflect atelectasis. 5. Small left renal cyst seen.  These results were called by telephone at the time of interpretation on 05/05/2014 at 4:18 am to [facility], who verbally acknowledged these results.   Electronically Signed   By: JGarald BaldingM.D.   On: 05/05/2014 04:18    Medications / Allergies: per chart  Antibiotics: Anti-infectives    Start     Dose/Rate Route Frequency Ordered Stop   05/05/14 0445  cefTRIAXone (ROCEPHIN) 1 g in dextrose 5 % 50 mL IVPB     1 g 100 mL/hr over 30 Minutes Intravenous  Once 05/05/14 0441     05/05/14 0445  metroNIDAZOLE (FLAGYL) IVPB 500 mg     500 mg 100 mL/hr over 60 Minutes Intravenous  Once 05/05/14 0441        Assessment  SOk Edwards 44y.o. male       Problem List:  Active Problems:   Acute appendicitis   History physical and CT scan strongly suspicious for acute appendicitis.  Rest of the differential diagnosis seems unlikely.  Plan:  Admit.  IV antibiotics.  IV pain and nausea control.  Diagnostic laparoscopy with appendectomy.  Discussed at length with the patient and hissignificant other.  Also with emergency room nurse in the room:  The anatomy & physiology  of the digestive tract was discussed.  The pathophysiology of appendicitis and other appendiceal disorders were discussed.  Natural history risks without surgery was discussed.   I feel the risks of no intervention will lead to serious problems that outweigh the operative risks; therefore, I recommended diagnostic laparoscopy with removal of appendix to remove the pathology.  Laparoscopic & open techniques were discussed.   I noted a good likelihood this will help address the problem.   Risks such as bleeding, infection, abscess, leak, reoperation, possible ostomy,  hernia, heart attack, stroke, death, and other risks were discussed.  Goals of post-operative recovery were discussed as well.  We will work to minimize complications.  Questions were answered.  The patient expresses understanding & wishes to proceed with surgery.  -Some reactive airway disease but Evidence of wheezing or hypoxia.Marland Kitchen  Keep on home meds/inhalers for now.  -VTE prophylaxis- SCDs, etc  -mobilize as tolerated to help recovery    Adin Hector, M.D., F.A.C.S. Gastrointestinal and Minimally Invasive Surgery [facility] [address], [place], [place] [address] [phone] Main / Paging   05/05/2014  Note: Portions of this report may have been transcribed using voice recognition software. Every effort was made to ensure accuracy; however, inadvertent computerized transcription errors may be present.   Any transcriptional errors that result from this process are unintentional.

## 2014-05-05 NOTE — ED Provider Notes (Signed)
Patient reports he started getting diffuse abdominal pain on 427 that Scott progressively worse with severe pain in his right lower quadrant. He states he did not have any nausea or vomiting but does have nausea since he came to the ED tonight.  Patient appears very uncomfortable. When he moves or breathes deeply he seems to have spasms of pain. When I palpate his abdomen he has referred pain into his right lower quadrant.  Medical screening examination/treatment/procedure(s) were conducted as a shared visit with non-physician practitioner(s) and myself.  I personally evaluated the patient during the encounter.   EKG Interpretation None       Devoria AlbeIva Zia Kanner, MD, Concha PyoFACEP   Trejon Duford, MD 05/05/14 [phone]

## 2014-05-05 NOTE — ED Provider Notes (Signed)
CSN: [id number]     Arrival date & time 05/04/14  2221 History   First MD Initiated Contact with Patient 05/05/14 0057     Chief Complaint  Patient presents with  . Abdominal Pain     (Consider location/radiation/quality/duration/timing/severity/associated sxs/prior Treatment) Patient is a 44 y.o. male presenting with abdominal pain. The history is provided by the patient. No language interpreter was used.  Abdominal Pain Associated symptoms: nausea and vomiting   Associated symptoms: no chills, no diarrhea, no dysuria and no fever   Associated symptoms comment:  Abdominal pain that started last night as central pain that focalized to the RLQ through the night. No fever. He has had nausea with vomiting. No diarrhea. He has some rectal pain with a mucus discharge and is afraid his abdominal pain is related to unprotected sex over this past weekend.    Past Medical History  Diagnosis Date  . Allergy   . Asthma   . Hypogonadism male    History reviewed. No pertinent past surgical history. No family history on file. History  Substance Use Topics  . Smoking status: Former Smoker    Quit date: 01/06/2001  . Smokeless tobacco: Never Used  . Alcohol Use: Not on file    Review of Systems  Constitutional: Negative for fever and chills.  HENT: Negative.   Respiratory: Negative.   Cardiovascular: Negative.   Gastrointestinal: Positive for nausea, vomiting and abdominal pain. Negative for diarrhea and blood in stool.  Genitourinary: Negative.  Negative for dysuria.  Musculoskeletal: Negative.  Negative for myalgias.  Skin: Negative.   Neurological: Negative.  Negative for syncope and light-headedness.      Allergies  Erythromycin  Home Medications   Prior to Admission medications   Medication Sig Start Date End Date Taking? Authorizing Provider  acyclovir (ZOVIRAX) 400 MG tablet Take 400 mg by mouth 2 (two) times daily. PRN 01/20/12  Yes Ronnald NianJohn C Lalonde, MD  albuterol  (PROVENTIL HFA;VENTOLIN HFA) 108 (90 BASE) MCG/ACT inhaler Inhale 2 puffs into the lungs every 6 (six) hours as needed. 09/10/10  Yes Ronnald NianJohn C Lalonde, MD  azelastine (ASTELIN) 0.1 % nasal spray USE 1 SPRAY IN EACH NOSTRIL TWICE A DAY. 12/19/13  Yes Ronnald NianJohn C Lalonde, MD  bismuth subsalicylate (PEPTO BISMOL) 262 MG/15ML suspension Take 30 mLs by mouth every 6 (six) hours as needed for indigestion.   Yes Historical Provider, MD  cetirizine (ZYRTEC) 10 MG tablet Take 1 tablet (10 mg total) by mouth daily. 02/28/13  Yes Ronnald NianJohn C Lalonde, MD  fluticasone [facility](FLONASE) 50 MCG/ACT nasal spray Place 2 sprays into both nostrils daily. 02/28/13  Yes Ronnald NianJohn C Lalonde, MD  ibuprofen (ADVIL,MOTRIN) 200 MG tablet Take 200 mg by mouth every 6 (six) hours as needed. PT TAKES 2 AM AND 2 PM   Yes Historical Provider, MD  Magnesium 200 MG TABS Take 1 tablet by mouth daily.     Yes Historical Provider, MD  magnesium citrate SOLN Take 1 Bottle by mouth once.   Yes Historical Provider, MD  mineral oil enema Place 1 enema rectally once.   Yes Historical Provider, MD  montelukast (SINGULAIR) 10 MG tablet TAKE 1 TABLET ONCE DAILY. 04/20/14  Yes Ronnald NianJohn C Lalonde, MD  Naproxen Sodium (ALEVE PO) Take by mouth 3 (three) times daily. PT TAKE 2 AM AND 2 PM   Yes Historical Provider, MD  omeprazole (PRILOSEC) 40 MG capsule TAKE 1 CAPSULE DAILY. 03/09/14  Yes Ronnald NianJohn C Lalonde, MD  pseudoephedrine (SUDAFED) 60 MG tablet  Take 60 mg by mouth every 4 (four) hours as needed for congestion.   Yes Historical Provider, MD  QVAR 80 MCG/ACT inhaler USE 1 PUFF TWICE A DAY. RINSE MOUTH. 03/09/14  Yes Ronnald Nian, MD  simethicone (MYLICON) 80 MG chewable tablet Chew 80 mg by mouth every 6 (six) hours as needed for flatulence.   Yes Historical Provider, MD  TRUVADA 200-300 MG per tablet TAKE 1 TABLET DAILY. 03/09/14  Yes Ronnald Nian, MD  clarithromycin (BIAXIN) 500 MG tablet Take 1 tablet (500 mg total) by mouth 2 (two) times daily. Patient not taking: Reported on  05/05/2014 03/13/14   Ronnald Nian, MD   BP 144/75 mmHg  Pulse 67  Temp(Src) 98.1 F (36.7 C) (Oral)  Resp 18  SpO2 100% Physical Exam  Constitutional: He appears well-developed and well-nourished.  HENT:  Head: Normocephalic.  Neck: Normal range of motion. Neck supple.  Cardiovascular: Normal rate and regular rhythm.   Pulmonary/Chest: Effort normal and breath sounds normal.  Abdominal: Soft. Bowel sounds are normal. There is no tenderness. There is no rebound and no guarding.  Generalized abdominal tenderness that is greatest and significant in RLQ. Soft abdomen with guarding. There is referred tenderness from the LLQ.  Musculoskeletal: Normal range of motion.  Neurological: He is alert. No cranial nerve deficit.  Skin: Skin is warm and dry. No rash noted.  Psychiatric: He has a normal mood and affect.    ED Course  Procedures (including critical care time) Labs Review Labs Reviewed  CBC WITH DIFFERENTIAL/PLATELET - Abnormal; Notable for the following:    Neutrophils Relative % 79 (*)    All other components within normal limits  COMPREHENSIVE METABOLIC PANEL - Abnormal; Notable for the following:    Glucose, Bld 123 (*)    BUN 24 (*)    All other components within normal limits  URINALYSIS, ROUTINE W REFLEX MICROSCOPIC - Abnormal; Notable for the following:    APPearance CLOUDY (*)    All other components within normal limits    Imaging Review Ct Abdomen Pelvis W Contrast  05/05/2014   CLINICAL DATA:  Abdominal pain and bloating, acute onset. Right hip pain and right lower quadrant pain. Mucus discharge from rectum. Initial encounter.  EXAM: CT ABDOMEN AND PELVIS WITH CONTRAST  TECHNIQUE: Multidetector CT imaging of the abdomen and pelvis was performed using the standard protocol following bolus administration of intravenous contrast.  CONTRAST:  OMNIPAQUE IOHEXOL 300 MG/ML  SOLN  COMPARISON:  MRI of the lumbar spine performed 04/16/2010  FINDINGS: Mild bibasilar  opacities likely reflect atelectasis.  The liver and spleen are unremarkable in appearance. The gallbladder is within normal limits. A tiny calcification at the pancreatic tail may reflect minimal chronic sequelae enthesophytes. The pancreas and adrenal glands are otherwise unremarkable.  A 1.2 cm cyst is noted at the upper pole of the left kidney. Nonspecific perinephric stranding is noted. The kidneys are otherwise unremarkable. There is no evidence of hydronephrosis. No renal or ureteral stones are seen.  The small bowel is unremarkable in appearance. The stomach is within normal limits. No acute vascular abnormalities are seen.  The appendix is dilated to 1.8 cm in maximal diameter, with an obstructing 1.3 cm appendicolith at the base of the appendix. Diffuse surrounding soft tissue inflammation and trace free fluid are seen, without definite evidence of perforation or abscess formation at this time.  The colon is largely filled with stool. There may be mild wall thickening along the rectum,  though this may simply reflect decompression. Would correlate for any symptoms of proctitis. Trace free fluid within the pelvis likely arises from the patient's acute appendicitis.  The bladder is mildly distended and grossly unremarkable. The prostate remains normal in size. No inguinal lymphadenopathy is seen.  No acute osseous abnormalities are identified.  IMPRESSION: 1. Acute appendicitis, with dilatation of the appendix to 1.8 cm in maximal diameter, and an obstructing 1.3 cm appendicolith at the base of the appendix. Diffuse surrounding soft tissue inflammation and trace free fluid, without definite evidence of perforation or abscess formation at this time. The appendix wraps inferior to the cecum. 2. Question of mild wall thickening along the rectum, though this may simply reflect decompression. Would correlate for any symptoms of proctitis. 3. Trace free fluid within the pelvis likely arises from the patient's acute  appendicitis. 4. Mild bibasilar airspace opacities likely reflect atelectasis. 5. Small left renal cyst seen.  These results were called by telephone at the time of interpretation on 05/05/2014 at 4:18 am to [facility] PA, who verbally acknowledged these results.   Electronically Signed   By: Roanna Raider M.D.   On: 05/05/2014 04:18     EKG Interpretation None      MDM   Final diagnoses:  None    1. Acute appendicitis  Pain is controlled with medications in ED. Lab studies essentially unremarkable. CT scan confirms diagnosis of acute appendicitis. Findings discussed with Dr. Lynelle Doctor and with Dr. Michaell Cowing (gen. Surgery) who will see the patient in the ED. Discussed antibiotics - Rocephin and Flagyl ordered per Dr. Michaell Cowing recommendation.    Elpidio Anis, PA-C 05/05/14 [id number]  Devoria Albe, MD 05/05/14 [phone]

## 2014-05-05 NOTE — Progress Notes (Signed)
Day of Surgery  Subjective: Still with RLQ pain.  Objective: Vital signs in last 24 hours: Temp:  [98 F (36.7 C)-98.1 F (36.7 C)] 98 F (36.7 C) (04/29 0704) Pulse Rate:  [67-93] 88 (04/29 0704) Resp:  [16-18] 18 (04/29 0438) BP: (144-157)/(70-84) 157/84 mmHg (04/29 0704) SpO2:  [97 %-100 %] 98 % (04/29 0704) Weight:  [91.173 kg (201 lb)] 91.173 kg (201 lb) (04/29 0744) Last BM Date: 04/27/14  Intake/Output from previous day: 04/28 0701 - 04/29 0700 In: -  Out: 450 [Urine:450] Intake/Output this shift:    PE: General- In NAD Abdomen-soft, RLQ tenderness and guarding  Lab Results:   Recent Labs  05/04/14 2241  WBC 9.0  HGB 13.9  HCT 41.2  PLT 176   BMET  Recent Labs  05/04/14 2241  NA 137  K 3.7  CL 102  CO2 25  GLUCOSE 123*  BUN 24*  CREATININE 0.84  CALCIUM 9.1   PT/INR No results for input(s): LABPROT, INR in the last 72 hours. Comprehensive Metabolic Panel:    Component Value Date/Time   NA 137 05/04/2014 2241   NA 137 03/17/2013 1153   K 3.7 05/04/2014 2241   K 4.2 03/17/2013 1153   CL 102 05/04/2014 2241   CL 103 03/17/2013 1153   CO2 25 05/04/2014 2241   CO2 26 03/17/2013 1153   BUN 24* 05/04/2014 2241   BUN 15 03/17/2013 1153   CREATININE 0.84 05/04/2014 2241   CREATININE 0.72 03/17/2013 1153   CREATININE 0.85 02/28/2013 1502   CREATININE 0.9 02/08/2010 0859   GLUCOSE 123* 05/04/2014 2241   GLUCOSE 98 03/17/2013 1153   CALCIUM 9.1 05/04/2014 2241   CALCIUM 8.9 03/17/2013 1153   AST 17 05/04/2014 2241   AST 20 03/17/2013 1153   ALT 16 05/04/2014 2241   ALT 24 03/17/2013 1153   ALKPHOS 75 05/04/2014 2241   ALKPHOS 103 03/17/2013 1153   BILITOT 0.8 05/04/2014 2241   BILITOT 0.5 03/17/2013 1153   PROT 7.1 05/04/2014 2241   PROT 6.5 03/17/2013 1153   ALBUMIN 4.5 05/04/2014 2241   ALBUMIN 4.3 03/17/2013 1153     Studies/Results: Ct Abdomen Pelvis W Contrast  05/05/2014   CLINICAL DATA:  Abdominal pain and bloating, acute  onset. Right hip pain and right lower quadrant pain. Mucus discharge from rectum. Initial encounter.  EXAM: CT ABDOMEN AND PELVIS WITH CONTRAST  TECHNIQUE: Multidetector CT imaging of the abdomen and pelvis was performed using the standard protocol following bolus administration of intravenous contrast.  CONTRAST:  OMNIPAQUE IOHEXOL 300 MG/ML  SOLN  COMPARISON:  MRI of the lumbar spine performed 04/16/2010  FINDINGS: Mild bibasilar opacities likely reflect atelectasis.  The liver and spleen are unremarkable in appearance. The gallbladder is within normal limits. A tiny calcification at the pancreatic tail may reflect minimal chronic sequelae enthesophytes. The pancreas and adrenal glands are otherwise unremarkable.  A 1.2 cm cyst is noted at the upper pole of the left kidney. Nonspecific perinephric stranding is noted. The kidneys are otherwise unremarkable. There is no evidence of hydronephrosis. No renal or ureteral stones are seen.  The small bowel is unremarkable in appearance. The stomach is within normal limits. No acute vascular abnormalities are seen.  The appendix is dilated to 1.8 cm in maximal diameter, with an obstructing 1.3 cm appendicolith at the base of the appendix. Diffuse surrounding soft tissue inflammation and trace free fluid are seen, without definite evidence of perforation or abscess formation at this time.  The colon is largely filled with stool. There may be mild wall thickening along the rectum, though this may simply reflect decompression. Would correlate for any symptoms of proctitis. Trace free fluid within the pelvis likely arises from the patient's acute appendicitis.  The bladder is mildly distended and grossly unremarkable. The prostate remains normal in size. No inguinal lymphadenopathy is seen.  No acute osseous abnormalities are identified.  IMPRESSION: 1. Acute appendicitis, with dilatation of the appendix to 1.8 cm in maximal diameter, and an obstructing 1.3 cm  appendicolith at the base of the appendix. Diffuse surrounding soft tissue inflammation and trace free fluid, without definite evidence of perforation or abscess formation at this time. The appendix wraps inferior to the cecum. 2. Question of mild wall thickening along the rectum, though this may simply reflect decompression. Would correlate for any symptoms of proctitis. 3. Trace free fluid within the pelvis likely arises from the patient's acute appendicitis. 4. Mild bibasilar airspace opacities likely reflect atelectasis. 5. Small left renal cyst seen.  These results were called by telephone at the time of interpretation on 05/05/2014 at 4:18 am to [facility] UP[facility] PA, who verbally acknowledged these results.   Electronically Signed   By: Roanna RaiderJeffery  Chang M.D.   On: 05/05/2014 04:18    Anti-infectives: Anti-infectives    Start     Dose/Rate Route Frequency Ordered Stop   05/05/14 1000  emtricitabine-tenofovir (TRUVADA) 200-300 MG per tablet 1 tablet     1 tablet Oral Daily 05/05/14 0453     05/05/14 0600  cefTRIAXone (ROCEPHIN) 2 g in dextrose 5 % 50 mL IVPB    Comments:  Pharmacy may adjust dosing strength / duration / interval for maximal efficacy   2 g 100 mL/hr over 30 Minutes Intravenous Daily 05/05/14 0451     05/05/14 0600  metroNIDAZOLE (FLAGYL) IVPB 500 mg     500 mg 100 mL/hr over 60 Minutes Intravenous 4 times per day 05/05/14 0451     05/05/14 0445  cefTRIAXone (ROCEPHIN) 1 g in dextrose 5 % 50 mL IVPB  Status:  Discontinued     1 g 100 mL/hr over 30 Minutes Intravenous  Once 05/05/14 0441 05/05/14 0451   05/05/14 0445  metroNIDAZOLE (FLAGYL) IVPB 500 mg  Status:  Discontinued     500 mg 100 mL/hr over 60 Minutes Intravenous  Once 05/05/14 0441 05/05/14 0451      Assessment Active Problems:   Acute appendicitis    LOS: 0 days   Plan: Laparoscopic possible open appendectomy later today.   Conan Mcmanaway Shela CommonsJ 05/05/2014

## 2014-05-06 LAB — CBC
HEMATOCRIT: 37.8 % — AB (ref 39.0–52.0)
Hemoglobin: 12.1 g/dL — ABNORMAL LOW (ref 13.0–17.0)
MCH: 28.3 pg (ref 26.0–34.0)
MCHC: 32 g/dL (ref 30.0–36.0)
MCV: 88.3 fL (ref 78.0–100.0)
Platelets: 141 10*3/uL — ABNORMAL LOW (ref 150–400)
RBC: 4.28 MIL/uL (ref 4.22–5.81)
RDW: 13.2 % (ref 11.5–15.5)
WBC: 6.7 10*3/uL (ref 4.0–10.5)

## 2014-05-06 MED ORDER — BECLOMETHASONE DIPROPIONATE 80 MCG/ACT IN AERS
1.0000 | INHALATION_SPRAY | Freq: Two times a day (BID) | RESPIRATORY_TRACT | Status: DC
Start: 2014-05-06 — End: 2014-05-06
  Filled 2014-05-06: qty 8.7

## 2014-05-06 MED ORDER — HYDROCODONE-ACETAMINOPHEN 5-325 MG PO TABS
1.0000 | ORAL_TABLET | ORAL | Status: DC | PRN
  Administered 2014-05-06 – 2014-05-10 (×21): 2 via ORAL
  Filled 2014-05-06 (×21): qty 2

## 2014-05-06 MED ORDER — BUDESONIDE 0.25 MG/2ML IN SUSP
0.2500 mg | Freq: Two times a day (BID) | RESPIRATORY_TRACT | Status: DC
  Administered 2014-05-06 – 2014-05-10 (×8): 0.25 mg via RESPIRATORY_TRACT
  Filled 2014-05-06 (×9): qty 2

## 2014-05-06 NOTE — Progress Notes (Addendum)
General Surgery Note  LOS: 1 day  POD -  1 Day Post-Op  Assessment/Plan: 1.  APPENDECTOMY LAPAROSCOPIC - 05/05/2014 - T. Rosenbower  For perforated appendicitis  WBC - 6,700 - 05/06/2014   On Zosyn - 4/29 >>>  Thirsty, would like to try liquids.  2. DVT prophylaxis - SQ Heparin  Active Problems:   Acute appendicitis  Subjective:  Doing okay, though sore.  No BM.  His mother is in the room (she used to work for [employer]) Objective:   Filed Vitals:   05/06/14 0504  BP: 123/61  Pulse: 68  Temp: 98.2 F (36.8 C)  Resp: 16     Intake/Output from previous day:  04/29 0701 - 04/30 0700 In: 2470.8 [I.V.:2470.8] Out: 2510 [Urine:2175; Drains:335]  Intake/Output this shift:      Physical Exam:   General: WN WM who is alert and oriented.    HEENT: Normal. Pupils equal. .   Lungs: Clear.  IS - 1,200 cc   Abdomen:  A few BS.   Wound: Dressings.  Drain in LLQ - 335 cc (probably secondary to intra op irrigation)   Lab Results:    Recent Labs  05/04/14 2241 05/06/14 0544  WBC 9.0 6.7  HGB 13.9 12.1*  HCT 41.2 37.8*  PLT 176 141*    BMET   Recent Labs  05/04/14 2241  NA 137  K 3.7  CL 102  CO2 25  GLUCOSE 123*  BUN 24*  CREATININE 0.84  CALCIUM 9.1    PT/INR  No results for input(s): LABPROT, INR in the last 72 hours.  ABG  No results for input(s): PHART, HCO3 in the last 72 hours.  Invalid input(s): PCO2, PO2   Studies/Results:  Ct Abdomen Pelvis W Contrast  05/05/2014   CLINICAL DATA:  Abdominal pain and bloating, acute onset. Right hip pain and right lower quadrant pain. Mucus discharge from rectum. Initial encounter.  EXAM: CT ABDOMEN AND PELVIS WITH CONTRAST  TECHNIQUE: Multidetector CT imaging of the abdomen and pelvis was performed using the standard protocol following bolus administration of intravenous contrast.  CONTRAST:  100mL OMNIPAQUE IOHEXOL 300 MG/ML  SOLN  COMPARISON:  MRI of the lumbar spine performed 04/16/2010   FINDINGS: Mild bibasilar opacities likely reflect atelectasis.  The liver and spleen are unremarkable in appearance. The gallbladder is within normal limits. A tiny calcification at the pancreatic tail may reflect minimal chronic sequelae enthesophytes. The pancreas and adrenal glands are otherwise unremarkable.  A 1.2 cm cyst is noted at the upper pole of the left kidney. Nonspecific perinephric stranding is noted. The kidneys are otherwise unremarkable. There is no evidence of hydronephrosis. No renal or ureteral stones are seen.  The small bowel is unremarkable in appearance. The stomach is within normal limits. No acute vascular abnormalities are seen.  The appendix is dilated to 1.8 cm in maximal diameter, with an obstructing 1.3 cm appendicolith at the base of the appendix. Diffuse surrounding soft tissue inflammation and trace free fluid are seen, without definite evidence of perforation or abscess formation at this time.  The colon is largely filled with stool. There may be mild wall thickening along the rectum, though this may simply reflect decompression. Would correlate for any symptoms of proctitis. Trace free fluid within the pelvis likely arises from the patient's acute appendicitis.  The bladder is mildly distended and grossly unremarkable. The prostate remains normal in size. No inguinal lymphadenopathy is seen.  No acute osseous abnormalities  are identified.  IMPRESSION: 1. Acute appendicitis, with dilatation of the appendix to 1.8 cm in maximal diameter, and an obstructing 1.3 cm appendicolith at the base of the appendix. Diffuse surrounding soft tissue inflammation and trace free fluid, without definite evidence of perforation or abscess formation at this time. The appendix wraps inferior to the cecum. 2. Question of mild wall thickening along the rectum, though this may simply reflect decompression. Would correlate for any symptoms of proctitis. 3. Trace free fluid within the pelvis likely arises  from the patient's acute appendicitis. 4. Mild bibasilar airspace opacities likely reflect atelectasis. 5. Small left renal cyst seen.  These results were called by telephone at the time of interpretation on 05/05/2014 at 4:18 am to [facility] PA, who verbally acknowledged these results.   Electronically Signed   By: Roanna Raider M.D.   On: 05/05/2014 04:18     Anti-infectives:   Anti-infectives    Start     Dose/Rate Route Frequency Ordered Stop   05/05/14 1800  piperacillin-tazobactam (ZOSYN) IVPB 3.375 g     3.375 g 12.5 mL/hr over 240 Minutes Intravenous 3 times per day 05/05/14 1608     05/05/14 1000  emtricitabine-tenofovir (TRUVADA) 200-300 MG per tablet 1 tablet     1 tablet Oral Daily 05/05/14 0453     05/05/14 0600  cefTRIAXone (ROCEPHIN) 2 g in dextrose 5 % 50 mL IVPB  Status:  Discontinued    Comments:  Pharmacy may adjust dosing strength / duration / interval for maximal efficacy   2 g 100 mL/hr over 30 Minutes Intravenous Daily 05/05/14 0451 05/05/14 1608   05/05/14 0600  metroNIDAZOLE (FLAGYL) IVPB 500 mg  Status:  Discontinued     500 mg 100 mL/hr over 60 Minutes Intravenous 4 times per day 05/05/14 0451 05/05/14 1608   05/05/14 0445  cefTRIAXone (ROCEPHIN) 1 g in dextrose 5 % 50 mL IVPB  Status:  Discontinued     1 g 100 mL/hr over 30 Minutes Intravenous  Once 05/05/14 0441 05/05/14 0451   05/05/14 0445  metroNIDAZOLE (FLAGYL) IVPB 500 mg  Status:  Discontinued     500 mg 100 mL/hr over 60 Minutes Intravenous  Once 05/05/14 0441 05/05/14 0451      Ovidio Kin, MD, FACS Pager: [phone] [facility] Office: [phone] 05/06/2014

## 2014-05-07 LAB — CBC WITH DIFFERENTIAL/PLATELET
BASOS PCT: 0 % (ref 0–1)
Basophils Absolute: 0 10*3/uL (ref 0.0–0.1)
Eosinophils Absolute: 0 10*3/uL (ref 0.0–0.7)
Eosinophils Relative: 0 % (ref 0–5)
HCT: 36 % — ABNORMAL LOW (ref 39.0–52.0)
Hemoglobin: 11.4 g/dL — ABNORMAL LOW (ref 13.0–17.0)
LYMPHS PCT: 10 % — AB (ref 12–46)
Lymphs Abs: 0.7 10*3/uL (ref 0.7–4.0)
MCH: 28.2 pg (ref 26.0–34.0)
MCHC: 31.7 g/dL (ref 30.0–36.0)
MCV: 89.1 fL (ref 78.0–100.0)
Monocytes Absolute: 0.7 10*3/uL (ref 0.1–1.0)
Monocytes Relative: 9 % (ref 3–12)
Neutro Abs: 5.9 10*3/uL (ref 1.7–7.7)
Neutrophils Relative %: 81 % — ABNORMAL HIGH (ref 43–77)
PLATELETS: 133 10*3/uL — AB (ref 150–400)
RBC: 4.04 MIL/uL — ABNORMAL LOW (ref 4.22–5.81)
RDW: 13 % (ref 11.5–15.5)
WBC: 7.4 10*3/uL (ref 4.0–10.5)

## 2014-05-07 NOTE — Progress Notes (Signed)
Patient ID: Jeff Hoffman, male   DOB: 03/04/1970, 44 y.o.   MRN: [id number] General Surgery Note  LOS: 2 days  POD -  2 Days Post-Op  Assessment/Plan: 1.  APPENDECTOMY LAPAROSCOPIC - 05/05/2014 - D. Newman  For perforated appendicitis     On Zosyn - 4/29 >>>  Will try full liquids.  Leukocytosis improving, but feeling worse today.  Would not be surprised if he gets an ileus.    2. DVT prophylaxis - SQ Heparin  Active Problems:   Acute appendicitis  Subjective:  having sweats this AM.  Feeling very hot.  No n/v.  Tolerated clears yesterday.     Objective:   Filed Vitals:   05/07/14 0545  BP: 112/78  Pulse: 82  Temp: 97.7 F (36.5 C)  Resp: 16     Intake/Output from previous day:  04/30 0701 - 05/01 0700 In: 4197.1 [P.O.:1520; I.V.:2427.1; IV Piggyback:250] Out: 2005 [Urine:1925; Drains:80]  Intake/Output this shift:  Total I/O In: -  Out: 70 [Drains:70]   Physical Exam:   General: WN WM who is alert and oriented. Looks uncomfortable, but just stood up.     HEENT: Normal. Pupils equal. .   Lungs: breathing comfortably   Abdomen:  A few BS.   Wound: Dressings.  Drain in LLQ - 335 cc (probably secondary to intra op irrigation)   Lab Results:     Recent Labs  05/06/14 0544 05/07/14 0540  WBC 6.7 7.4  HGB 12.1* 11.4*  HCT 37.8* 36.0*  PLT 141* 133*    BMET    Recent Labs  05/04/14 2241  NA 137  K 3.7  CL 102  CO2 25  GLUCOSE 123*  BUN 24*  CREATININE 0.84  CALCIUM 9.1    PT/INR  No results for input(s): LABPROT, INR in the last 72 hours.  ABG  No results for input(s): PHART, HCO3 in the last 72 hours.  Invalid input(s): PCO2, PO2   Studies/Results:  No results found.   Anti-infectives:   Anti-infectives    Start     Dose/Rate Route Frequency Ordered Stop   05/05/14 1800  piperacillin-tazobactam (ZOSYN) IVPB 3.375 g     3.375 g 12.5 mL/hr over 240 Minutes Intravenous 3 times per day 05/05/14 1608     05/05/14 1000   emtricitabine-tenofovir (TRUVADA) 200-300 MG per tablet 1 tablet     1 tablet Oral Daily 05/05/14 0453     05/05/14 0600  cefTRIAXone (ROCEPHIN) 2 g in dextrose 5 % 50 mL IVPB  Status:  Discontinued    Comments:  Pharmacy may adjust dosing strength / duration / interval for maximal efficacy   2 g 100 mL/hr over 30 Minutes Intravenous Daily 05/05/14 0451 05/05/14 1608   05/05/14 0600  metroNIDAZOLE (FLAGYL) IVPB 500 mg  Status:  Discontinued     500 mg 100 mL/hr over 60 Minutes Intravenous 4 times per day 05/05/14 0451 05/05/14 1608   05/05/14 0445  cefTRIAXone (ROCEPHIN) 1 g in dextrose 5 % 50 mL IVPB  Status:  Discontinued     1 g 100 mL/hr over 30 Minutes Intravenous  Once 05/05/14 0441 05/05/14 0451   05/05/14 0445  metroNIDAZOLE (FLAGYL) IVPB 500 mg  Status:  Discontinued     500 mg 100 mL/hr over 60 Minutes Intravenous  Once 05/05/14 0441 05/05/14 0451      [facility] Office: [phone] 05/07/2014

## 2014-05-08 ENCOUNTER — Encounter (HOSPITAL_COMMUNITY): Payer: Self-pay | Admitting: General Surgery

## 2014-05-08 NOTE — Progress Notes (Signed)
Patient ID: Jeff Hoffman, male   DOB: 01/15/1970, 44 y.o.   MRN: [id number]     [facility]      [address]   [place], [place] [id number]    Phone: [phone] FAX: [phone]     Subjective: Hiccups, burps.  No flatus.  Gas pains.  Walking.  Voiding.  VSS. Afebrile.   Objective:  Vital signs:  Filed Vitals:   05/07/14 0846 05/07/14 1405 05/07/14 1946 05/07/14 2230  BP:  113/63  139/67  Pulse:  103  110  Temp:  98.9 F (37.2 C)  98.9 F (37.2 C)  TempSrc:  Oral  Oral  Resp:  16  16  Height:      Weight:      SpO2: 94% 97% 99% 97%    Last BM Date: 04/27/14  Intake/Output   Yesterday:  05/01 0701 - 05/02 0700 In: 3300 [P.O.:1200; I.V.:2000; IV Piggyback:100] Out: 2880 [Urine:2625; Drains:255] This shift:    I/O last 3 completed shifts: In: 5357.1 [P.O.:2080; I.V.:2927.1; IV Piggyback:350] Out: 4045 [Urine:3750; Drains:295]    Physical Exam: General: Pt awake/alert/oriented x4 in no acute distress Chest: cta. No chest wall pain w good excursion CV:  Pulses intact.  Regular rhythm MS: Normal AROM mjr joints.  No obvious deformity Abdomen: Soft.  +bs. distended.  Mildly tender at incisions.  Dressings are c/d/i.  Drain with serous output. No evidence of peritonitis.  No incarcerated hernias. Ext:  SCDs BLE.  No mjr edema.  No cyanosis Skin: No petechiae / purpura   Problem List:   Active Problems:   Acute appendicitis    Results:   Labs: Results for orders placed or performed during the hospital encounter of 05/04/14 (from the past 48 hour(s))  CBC with Differential/Platelet     Status: Abnormal   Collection Time: 05/07/14  5:40 AM  Result Value Ref Range   WBC 7.4 4.0 - 10.5 K/uL   RBC 4.04 (L) 4.22 - 5.81 MIL/uL   Hemoglobin 11.4 (L) 13.0 - 17.0 g/dL   HCT 95.236.0 (L) 84.139.0 - 32.452.0 %   MCV 89.1 78.0 - 100.0 fL   MCH 28.2 26.0 - 34.0 pg   MCHC 31.7 30.0 - 36.0 g/dL   RDW 40.113.0 02.711.5 - 25.315.5 %   Platelets  133 (L) 150 - 400 K/uL   Neutrophils Relative % 81 (H) 43 - 77 %   Neutro Abs 5.9 1.7 - 7.7 K/uL   Lymphocytes Relative 10 (L) 12 - 46 %   Lymphs Abs 0.7 0.7 - 4.0 K/uL   Monocytes Relative 9 3 - 12 %   Monocytes Absolute 0.7 0.1 - 1.0 K/uL   Eosinophils Relative 0 0 - 5 %   Eosinophils Absolute 0.0 0.0 - 0.7 K/uL   Basophils Relative 0 0 - 1 %   Basophils Absolute 0.0 0.0 - 0.1 K/uL    Imaging / Studies: No results found.  Medications / Allergies:  Scheduled Meds: . azelastine  1 spray Each Nare BID  . budesonide (PULMICORT) nebulizer solution  0.25 mg Nebulization BID  . emtricitabine-tenofovir  1 tablet Oral Daily  . fluticasone  2 spray Each Nare Daily  . heparin  5,000 Units Subcutaneous 3 times per day  . lip balm  1 application Topical BID  . pantoprazole (PROTONIX) IV  40 mg Intravenous Q24H  . piperacillin-tazobactam (ZOSYN)  IV  3.375 g Intravenous 3 times per day   Continuous Infusions: . dextrose  5 % and 0.9 % NaCl with KCl 20 mEq/L 125 mL/hr at 05/08/14 0238   PRN Meds:.acetaminophen, acetaminophen, albuterol, diphenhydrAMINE, HYDROcodone-acetaminophen, magic mouthwash, menthol-cetylpyridinium, morphine injection, naproxen, ondansetron (ZOFRAN) IV **OR** [DISCONTINUED] ondansetron (ZOFRAN) IV, promethazine  Antibiotics: Anti-infectives    Start     Dose/Rate Route Frequency Ordered Stop   05/05/14 1800  piperacillin-tazobactam (ZOSYN) IVPB 3.375 g     3.375 g 12.5 mL/hr over 240 Minutes Intravenous 3 times per day 05/05/14 1608     05/05/14 1000  emtricitabine-tenofovir (TRUVADA) 200-300 MG per tablet 1 tablet     1 tablet Oral Daily 05/05/14 0453     05/05/14 0600  cefTRIAXone (ROCEPHIN) 2 g in dextrose 5 % 50 mL IVPB  Status:  Discontinued    Comments:  Pharmacy may adjust dosing strength / duration / interval for maximal efficacy   2 g 100 mL/hr over 30 Minutes Intravenous Daily 05/05/14 0451 05/05/14 1608   05/05/14 0600  metroNIDAZOLE (FLAGYL) IVPB 500 mg   Status:  Discontinued     500 mg 100 mL/hr over 60 Minutes Intravenous 4 times per day 05/05/14 0451 05/05/14 1608   05/05/14 0445  cefTRIAXone (ROCEPHIN) 1 g in dextrose 5 % 50 mL IVPB  Status:  Discontinued     1 g 100 mL/hr over 30 Minutes Intravenous  Once 05/05/14 0441 05/05/14 0451   05/05/14 0445  metroNIDAZOLE (FLAGYL) IVPB 500 mg  Status:  Discontinued     500 mg 100 mL/hr over 60 Minutes Intravenous  Once 05/05/14 0441 05/05/14 0451        Assessment/Plan Perforated appendicitis  POD#3 laparoscopic appendectomy---Dr. Ezzard Standing -drain care -IV atbx -mobilize -labs in AM VTE prophylaxis-SCD/heparin ID-zosyn D#3/7 FEN-ileus, continue with fulls, advance when bowel function return or back down if nausea develops.  IVF, IV pain meds   Ashok Norris, [facility] Pager [phone](7A-4:30P)   05/08/2014 7:43 AM

## 2014-05-09 NOTE — Progress Notes (Signed)
4 Days Post-Op  Subjective: Feeling better, + BM x 2 this Am.  Doing well with full liquids and ready to try some soft food.  Objective: Vital signs in last 24 hours: Temp:  [97.9 F (36.6 C)-98.9 F (37.2 C)] 97.9 F (36.6 C) (05/03 0518) Pulse Rate:  [78-95] 78 (05/03 0518) Resp:  [16-18] 16 (05/03 0518) BP: (124-141)/(60-65) 124/60 mmHg (05/03 0518) SpO2:  [97 %-99 %] 97 % (05/03 0905) Last BM Date: 05/09/14 PO 750 Drain 105  Fluid is still cloudy Stools:  1 this Am Full liquids WBC 7.4 Platelets 133K  Intake/Output from previous day: 05/02 0701 - 05/03 0700 In: 4450 [P.O.:750; I.V.:3500; IV Piggyback:200] Out: 3880 [Urine:3775; Drains:105] Intake/Output this shift: Total I/O In: -  Out: 250 [Urine:250]  General appearance: alert, cooperative and no distress Resp: clear to auscultation bilaterally GI: soft, sore, Port sites look good. Drain fluid is a little cloudy.  Lab Results:   Recent Labs  05/07/14 0540  WBC 7.4  HGB 11.4*  HCT 36.0*  PLT 133*    BMET No results for input(s): NA, K, CL, CO2, GLUCOSE, BUN, CREATININE, CALCIUM in the last 72 hours. PT/INR No results for input(s): LABPROT, INR in the last 72 hours.   Recent Labs Lab 05/04/14 2241  AST 17  ALT 16  ALKPHOS 75  BILITOT 0.8  PROT 7.1  ALBUMIN 4.5     Lipase  No results found for: LIPASE   Studies/Results: No results found.  Medications: . azelastine  1 spray Each Nare BID  . budesonide (PULMICORT) nebulizer solution  0.25 mg Nebulization BID  . emtricitabine-tenofovir  1 tablet Oral Daily  . fluticasone  2 spray Each Nare Daily  . heparin  5,000 Units Subcutaneous 3 times per day  . lip balm  1 application Topical BID  . pantoprazole (PROTONIX) IV  40 mg Intravenous Q24H  . piperacillin-tazobactam (ZOSYN)  IV  3.375 g Intravenous 3 times per day   . dextrose 5 % and 0.9 % NaCl with KCl 20 mEq/L 125 mL/hr at 05/09/14 0304   Prior to Admission medications   Medication  Sig Start Date End Date Taking? Authorizing Provider  acyclovir (ZOVIRAX) 400 MG tablet Take 400 mg by mouth 2 (two) times daily. PRN 01/20/12  Yes Ronnald NianJohn C Lalonde, MD  albuterol (PROVENTIL HFA;VENTOLIN HFA) 108 (90 BASE) MCG/ACT inhaler Inhale 2 puffs into the lungs every 6 (six) hours as needed. 09/10/10  Yes Ronnald NianJohn C Lalonde, MD  azelastine (ASTELIN) 0.1 % nasal spray USE 1 SPRAY IN EACH NOSTRIL TWICE A DAY. 12/19/13  Yes Ronnald NianJohn C Lalonde, MD  bismuth subsalicylate (PEPTO BISMOL) 262 MG/15ML suspension Take 30 mLs by mouth every 6 (six) hours as needed for indigestion.   Yes Historical Provider, MD  cetirizine (ZYRTEC) 10 MG tablet Take 1 tablet (10 mg total) by mouth daily. 02/28/13  Yes Ronnald NianJohn C Lalonde, MD  fluticasone [facility](FLONASE) 50 MCG/ACT nasal spray Place 2 sprays into both nostrils daily. 02/28/13  Yes Ronnald NianJohn C Lalonde, MD  ibuprofen (ADVIL,MOTRIN) 200 MG tablet Take 200 mg by mouth every 6 (six) hours as needed. PT TAKES 2 AM AND 2 PM   Yes Historical Provider, MD  Magnesium 200 MG TABS Take 1 tablet by mouth daily.     Yes Historical Provider, MD  magnesium citrate SOLN Take 1 Bottle by mouth once.   Yes Historical Provider, MD  mineral oil enema Place 1 enema rectally once.   Yes Historical Provider, MD  montelukast (SINGULAIR) 10 MG tablet TAKE 1 TABLET ONCE DAILY. 04/20/14  Yes Ronnald Nian, MD  Naproxen Sodium (ALEVE PO) Take by mouth 3 (three) times daily. PT TAKE 2 AM AND 2 PM   Yes Historical Provider, MD  omeprazole (PRILOSEC) 40 MG capsule TAKE 1 CAPSULE DAILY. 03/09/14  Yes Ronnald Nian, MD  pseudoephedrine (SUDAFED) 60 MG tablet Take 60 mg by mouth every 4 (four) hours as needed for congestion.   Yes Historical Provider, MD  QVAR 80 MCG/ACT inhaler USE 1 PUFF TWICE A DAY. RINSE MOUTH. 03/09/14  Yes Ronnald Nian, MD  simethicone (MYLICON) 80 MG chewable tablet Chew 80 mg by mouth every 6 (six) hours as needed for flatulence.   Yes Historical Provider, MD  TRUVADA 200-300 MG per tablet TAKE 1  TABLET DAILY. 03/09/14  Yes Ronnald Nian, MD  clarithromycin (BIAXIN) 500 MG tablet Take 1 tablet (500 mg total) by mouth 2 (two) times daily. Patient not taking: Reported on 05/05/2014 03/13/14   Ronnald Nian, MD     Assessment/Plan Perforated appendix with feculent peritonitis S/p laparoscopic appendectomy 05/05/14 Preexposure HIV prophylaxis Day 5 Zosyn/also on TRUVADA 200-300 MG  DVT heparin/SCD  Plan:  Soft diet, continue IV antibiotics.   LOS: 4 days    Jeff Hoffman 05/09/2014

## 2014-05-10 LAB — CBC
HCT: 30.2 % — ABNORMAL LOW (ref 39.0–52.0)
HEMOGLOBIN: 9.9 g/dL — AB (ref 13.0–17.0)
MCH: 28.9 pg (ref 26.0–34.0)
MCHC: 32.8 g/dL (ref 30.0–36.0)
MCV: 88.3 fL (ref 78.0–100.0)
PLATELETS: 186 10*3/uL (ref 150–400)
RBC: 3.42 MIL/uL — AB (ref 4.22–5.81)
RDW: 13.5 % (ref 11.5–15.5)
WBC: 4.8 10*3/uL (ref 4.0–10.5)

## 2014-05-10 MED ORDER — HYDROCODONE-ACETAMINOPHEN 5-325 MG PO TABS: 1.0000 | ORAL_TABLET | ORAL | Status: DC | PRN

## 2014-05-10 MED ORDER — ACETAMINOPHEN 325 MG PO TABS: 325.0000 mg | ORAL_TABLET | Freq: Four times a day (QID) | ORAL | Status: DC | PRN

## 2014-05-10 MED ORDER — AMOXICILLIN-POT CLAVULANATE 875-125 MG PO TABS: 1.0000 | ORAL_TABLET | Freq: Two times a day (BID) | ORAL | Status: DC

## 2014-05-10 MED ORDER — SACCHAROMYCES BOULARDII 250 MG PO CAPS: 250.0000 mg | ORAL_CAPSULE | Freq: Two times a day (BID) | ORAL | Status: DC

## 2014-05-10 NOTE — Discharge Instructions (Signed)

## 2014-05-10 NOTE — Discharge Summary (Signed)
Physician Discharge Summary  Jeff Hoffman YNW:[id number] DOB: 03/17/1970 DOA: 05/04/2014  PCP: Carollee HerterLALONDE,JOHN CHARLES, MD  Consultation: none  Admit date: 05/04/2014 Discharge date: 05/10/2014  Recommendations for Outpatient Follow-up:   Follow-up Information    [facility]   Specialty:  General Surgery   Contact information:   [address] [place] [address] [phone]      Discharge Diagnoses:  1. Perforated appendicitis    Surgical Procedure: laparoscopic appendectomy---Dr. Ezzard StandingNewman 05/05/14  Discharge Condition: stable Disposition: home  Diet recommendation: regular  Filed Weights   05/05/14 0744  Weight: 91.173 kg (201 lb)    Filed Vitals:   05/10/14 0511  BP: 132/60  Pulse: 59  Temp: 97.9 F (36.6 C)  Resp: 16     Hospital Course:  Jeff Hoffman is a 44 year old [facility].  CT of abdomen and pelvis showed appendicitis.  The patient was admitted and underwent the procedure listed above.  He tolerated the procedure well and was transferred to the floor.  He was found to have a perforated appendix and kept in the hospital on IV zosyn.  His diet was advanced as ileus resolved.  His drain was removed on discharge day with little serous output.  He remained stable and white count normalized.  On POD#5 the patient was felt stable for discharge.  He will complete 14 days of antibiotics per attending.  I provided him with florastor.  Medication risks, benefits and therapeutic alternatives were reviewed with the patient.  He verbalizes understanding.  I provided him with a follow up with Dr. Ezzard StandingNewman.  He was encouraged to call with questions or concerns.  He was resumed on his prophylactic HIV anti-retrovirals.    Physical Exam: General: Pt awake/alert/oriented x4 in no acute distress Chest: cta. No chest wall pain w good excursion CV: Pulses intact. Regular rhythm MS: Normal AROM mjr joints. No  obvious deformity Abdomen: Soft. +bs. nondistended. Mildly tender at incisions. Dressings are c/d/i. Drain with serous output. No evidence of peritonitis. No incarcerated hernias. Ext: SCDs BLE. No mjr edema. No cyanosis Skin: No petechiae / purpura  Discharge Instructions     Medication List    STOP taking these medications        clarithromycin 500 MG tablet  Commonly known as:  BIAXIN     ibuprofen 200 MG tablet  Commonly known as:  ADVIL,MOTRIN      TAKE these medications        acetaminophen 325 MG tablet  Commonly known as:  TYLENOL  Take 1-2 tablets (325-650 mg total) by mouth every 6 (six) hours as needed for fever, headache, mild pain or moderate pain.     acyclovir 400 MG tablet  Commonly known as:  ZOVIRAX  Take 400 mg by mouth 2 (two) times daily. PRN     albuterol 108 (90 BASE) MCG/ACT inhaler  Commonly known as:  PROVENTIL HFA;VENTOLIN HFA  Inhale 2 puffs into the lungs every 6 (six) hours as needed.     ALEVE PO  Take by mouth 3 (three) times daily. PT TAKE 2 AM AND 2 PM     amoxicillin-clavulanate 875-125 MG per tablet  Commonly known as:  AUGMENTIN  Take 1 tablet by mouth 2 (two) times daily.     azelastine 0.1 % nasal spray  Commonly known as:  ASTELIN  USE 1 SPRAY IN EACH NOSTRIL TWICE A DAY.  bismuth subsalicylate 262 MG/15ML suspension  Commonly known as:  PEPTO BISMOL  Take 30 mLs by mouth every 6 (six) hours as needed for indigestion.     cetirizine 10 MG tablet  Commonly known as:  ZYRTEC  Take 1 tablet (10 mg total) by mouth daily.     fluticasone 50 MCG/ACT nasal spray  Commonly known as:  FLONASE  Place 2 sprays into both nostrils daily.     HYDROcodone-acetaminophen 5-325 MG per tablet  Commonly known as:  NORCO/VICODIN  Take 1-2 tablets by mouth every 4 (four) hours as needed for moderate pain.     Magnesium 200 MG Tabs  Take 1 tablet by mouth daily.     magnesium citrate Soln  Take 1 Bottle by mouth once.      mineral oil enema  Place 1 enema rectally once.     montelukast 10 MG tablet  Commonly known as:  SINGULAIR  TAKE 1 TABLET ONCE DAILY.     omeprazole 40 MG capsule  Commonly known as:  PRILOSEC  TAKE 1 CAPSULE DAILY.     pseudoephedrine 60 MG tablet  Commonly known as:  SUDAFED  Take 60 mg by mouth every 4 (four) hours as needed for congestion.     QVAR 80 MCG/ACT inhaler  Generic drug:  beclomethasone  USE 1 PUFF TWICE A DAY. RINSE MOUTH.     saccharomyces boulardii 250 MG capsule  Commonly known as:  FLORASTOR  Take 1 capsule (250 mg total) by mouth 2 (two) times daily.     simethicone 80 MG chewable tablet  Commonly known as:  MYLICON  Chew 80 mg by mouth every 6 (six) hours as needed for flatulence.     TRUVADA 200-300 MG per tablet  Generic drug:  emtricitabine-tenofovir  TAKE 1 TABLET DAILY.           Follow-up Information    [facility]   Specialty:  General Surgery   Contact information:   [address] [place] [address] [phone]        The results of significant diagnostics from this hospitalization (including imaging, microbiology, ancillary and laboratory) are listed below for reference.    Significant Diagnostic Studies: Ct Abdomen Pelvis W Contrast  05/05/2014   CLINICAL DATA:  Abdominal pain and bloating, acute onset. Right hip pain and right lower quadrant pain. Mucus discharge from rectum. Initial encounter.  EXAM: CT ABDOMEN AND PELVIS WITH CONTRAST  TECHNIQUE: Multidetector CT imaging of the abdomen and pelvis was performed using the standard protocol following bolus administration of intravenous contrast.  CONTRAST:  100mL OMNIPAQUE IOHEXOL 300 MG/ML  SOLN  COMPARISON:  MRI of the lumbar spine performed 04/16/2010  FINDINGS: Mild bibasilar opacities likely reflect atelectasis.  The liver and spleen are unremarkable in appearance. The gallbladder is within normal limits. A tiny calcification at the  pancreatic tail may reflect minimal chronic sequelae enthesophytes. The pancreas and adrenal glands are otherwise unremarkable.  A 1.2 cm cyst is noted at the upper pole of the left kidney. Nonspecific perinephric stranding is noted. The kidneys are otherwise unremarkable. There is no evidence of hydronephrosis. No renal or ureteral stones are seen.  The small bowel is unremarkable in appearance. The stomach is within normal limits. No acute vascular abnormalities are seen.  The appendix is dilated to 1.8 cm in maximal diameter, with an obstructing 1.3 cm appendicolith at the base of the appendix. Diffuse surrounding soft tissue inflammation and  trace free fluid are seen, without definite evidence of perforation or abscess formation at this time.  The colon is largely filled with stool. There may be mild wall thickening along the rectum, though this may simply reflect decompression. Would correlate for any symptoms of proctitis. Trace free fluid within the pelvis likely arises from the patient's acute appendicitis.  The bladder is mildly distended and grossly unremarkable. The prostate remains normal in size. No inguinal lymphadenopathy is seen.  No acute osseous abnormalities are identified.  IMPRESSION: 1. Acute appendicitis, with dilatation of the appendix to 1.8 cm in maximal diameter, and an obstructing 1.3 cm appendicolith at the base of the appendix. Diffuse surrounding soft tissue inflammation and trace free fluid, without definite evidence of perforation or abscess formation at this time. The appendix wraps inferior to the cecum. 2. Question of mild wall thickening along the rectum, though this may simply reflect decompression. Would correlate for any symptoms of proctitis. 3. Trace free fluid within the pelvis likely arises from the patient's acute appendicitis. 4. Mild bibasilar airspace opacities likely reflect atelectasis. 5. Small left renal cyst seen.  These results were called by telephone at the  time of interpretation on 05/05/2014 at 4:18 am to [facility] PA, who verbally acknowledged these results.   Electronically Signed   By: Roanna Raider M.D.   On: 05/05/2014 04:18    Microbiology: Recent Results (from the past 240 hour(s))  Surgical pcr screen     Status: None   Collection Time: 05/05/14  7:53 AM  Result Value Ref Range Status   MRSA, PCR NEGATIVE NEGATIVE Final   Staphylococcus aureus NEGATIVE NEGATIVE Final    Comment:        The Xpert SA Assay (FDA approved for NASAL specimens in patients over 44 years of age), is one component of a comprehensive surveillance program.  Test performance has been validated by [facility] for patients greater than or equal to 1 year old. It is not intended to diagnose infection nor to guide or monitor treatment.      Labs: Basic Metabolic Panel:  Recent Labs Lab 05/04/14 2241  NA 137  K 3.7  CL 102  CO2 25  GLUCOSE 123*  BUN 24*  CREATININE 0.84  CALCIUM 9.1   Liver Function Tests:  Recent Labs Lab 05/04/14 2241  AST 17  ALT 16  ALKPHOS 75  BILITOT 0.8  PROT 7.1  ALBUMIN 4.5   No results for input(s): LIPASE, AMYLASE in the last 168 hours. No results for input(s): AMMONIA in the last 168 hours. CBC:  Recent Labs Lab 05/04/14 2241 05/06/14 0544 05/07/14 0540 05/10/14 0430  WBC 9.0 6.7 7.4 4.8  NEUTROABS 7.0  --  5.9  --   HGB 13.9 12.1* 11.4* 9.9*  HCT 41.2 37.8* 36.0* 30.2*  MCV 86.9 88.3 89.1 88.3  PLT 176 141* 133* 186   Cardiac Enzymes: No results for input(s): CKTOTAL, CKMB, CKMBINDEX, TROPONINI in the last 168 hours. BNP: BNP (last 3 results) No results for input(s): BNP in the last 8760 hours.  ProBNP (last 3 results) No results for input(s): PROBNP in the last 8760 hours.  CBG: No results for input(s): GLUCAP in the last 168 hours.  Active Problems:   Acute appendicitis   Time coordinating discharge: <30 mins  Signed:  Laren Whaling, ANP-BC

## 2014-05-14 ENCOUNTER — Telehealth: Payer: Self-pay | Admitting: General Surgery

## 2014-05-14 ENCOUNTER — Other Ambulatory Visit: Payer: Self-pay | Admitting: Family Medicine

## 2014-05-14 MED ORDER — HYDROCODONE-ACETAMINOPHEN 5-325 MG PO TABS: 1.0000 | ORAL_TABLET | ORAL | Status: DC | PRN

## 2014-05-14 NOTE — Progress Notes (Signed)
He had a recent appendectomy and is still having some pain. He is reducing his pain medications but is about to run out. I will give him a refill but also recommend ibuprofen 800 mg 3 times a day

## 2014-05-14 NOTE — Telephone Encounter (Signed)
Pt called on Sunday stating he had 2 pain pills left and wanted a refill.  He is s/p lap appendectomy on 4/28.  I stated to him that it was not our policy to give prescriptions for narcotics over the weekend and that he would need to call the office on Mon.  I recommended that he either use OTC pain relievers or go the ED if his pain was uncontrolled.  The patient became belligerent and began to yell and curse stating he was not able to wait until tomorrow am for help.  I reiterated that he should go the ED if he was in that much pain and tried to ascertain where he was hurting, but he continued to yell and curse and then hung up.

## 2014-05-16 ENCOUNTER — Telehealth: Payer: Self-pay | Admitting: Surgery

## 2014-05-16 NOTE — Telephone Encounter (Signed)
Mr. Jeff Hoffman had a lap appendectomy by Dr. Abbey Chattersosenbower on 05/05/2014 for a perforated appendicitis.  He called around 10PM.  He has some redness around his RUQ incision and he is running a fever as high a 102. He is still on antibiotics he was discharged on.  He has taken some ibuprofen for his fever.  He is set to see Dr. Abbey Chattersosenbower around 9:30 AM tomorrow.  I told him that he could put warm soaks on his RUQ incision.  That if he felt bad enough, he should come to the ER.  He thought that he could make it until tomorrow AM to see Dr. Abbey Chattersosenbower. I told him that if he is running a fever this far out from an appendectomy, he may need a CT scan, but Dr. Abbey Chattersosenbower could determine that at the time he saw him.  He'll call for further problems.  [name], MD, [facility] Pager: [phone] Office phone:  [phone]

## 2014-05-17 ENCOUNTER — Encounter (HOSPITAL_COMMUNITY): Payer: Self-pay

## 2014-05-17 ENCOUNTER — Encounter: Payer: Self-pay | Admitting: General Surgery

## 2014-05-17 ENCOUNTER — Other Ambulatory Visit: Payer: Self-pay

## 2014-05-17 ENCOUNTER — Other Ambulatory Visit: Payer: Self-pay | Admitting: *Deleted

## 2014-05-17 ENCOUNTER — Inpatient Hospital Stay (HOSPITAL_COMMUNITY)
Admission: AD | Admit: 2014-05-17 | Discharge: 2014-05-25 | DRG: 862 | Disposition: A | Discharge status: Home Health | Payer: BLUE CROSS/BLUE SHIELD | Source: Ambulatory Visit | Attending: Surgery | Admitting: Surgery

## 2014-05-17 DIAGNOSIS — R222 Localized swelling, mass and lump, trunk: Secondary | ICD-10-CM | POA: Diagnosis present

## 2014-05-17 DIAGNOSIS — Y836 Removal of other organ (partial) (total) as the cause of abnormal reaction of the patient, or of later complication, without mention of misadventure at the time of the procedure: Secondary | ICD-10-CM | POA: Diagnosis present

## 2014-05-17 DIAGNOSIS — Z791 Long term (current) use of non-steroidal anti-inflammatories (NSAID): Secondary | ICD-10-CM | POA: Diagnosis not present

## 2014-05-17 DIAGNOSIS — Z881 Allergy status to other antibiotic agents status: Secondary | ICD-10-CM

## 2014-05-17 DIAGNOSIS — K3532 Acute appendicitis with perforation and localized peritonitis, without abscess: Secondary | ICD-10-CM

## 2014-05-17 DIAGNOSIS — Z888 Allergy status to other drugs, medicaments and biological substances status: Secondary | ICD-10-CM

## 2014-05-17 DIAGNOSIS — K651 Peritoneal abscess: Secondary | ICD-10-CM | POA: Diagnosis present

## 2014-05-17 DIAGNOSIS — J45909 Unspecified asthma, uncomplicated: Secondary | ICD-10-CM | POA: Diagnosis present

## 2014-05-17 DIAGNOSIS — T814XXA Infection following a procedure, initial encounter: Secondary | ICD-10-CM | POA: Diagnosis present

## 2014-05-17 DIAGNOSIS — Z79899 Other long term (current) drug therapy: Secondary | ICD-10-CM | POA: Diagnosis not present

## 2014-05-17 DIAGNOSIS — Z79891 Long term (current) use of opiate analgesic: Secondary | ICD-10-CM

## 2014-05-17 DIAGNOSIS — Z21 Asymptomatic human immunodeficiency virus [HIV] infection status: Secondary | ICD-10-CM | POA: Diagnosis present

## 2014-05-17 HISTORY — DX: Gastro-esophageal reflux disease without esophagitis: K21.9

## 2014-05-17 LAB — BASIC METABOLIC PANEL
ANION GAP: 11 (ref 5–15)
BUN: 21 mg/dL — AB (ref 6–20)
CALCIUM: 8.5 mg/dL — AB (ref 8.9–10.3)
CO2: 27 mmol/L (ref 22–32)
CREATININE: 0.87 mg/dL (ref 0.61–1.24)
Chloride: 94 mmol/L — ABNORMAL LOW (ref 101–111)
GFR calc Af Amer: >60 mL/min (ref >60)
GFR calc non Af Amer: >60 mL/min (ref >60)
Glucose, Bld: 101 mg/dL — ABNORMAL HIGH (ref 70–99)
Potassium: 4.2 mmol/L (ref 3.5–5.1)
Sodium: 132 mmol/L — ABNORMAL LOW (ref 135–145)

## 2014-05-17 LAB — CBC WITH DIFFERENTIAL/PLATELET
BASOS ABS: 0 10*3/uL (ref 0.0–0.1)
BASOS PCT: 0 % (ref 0–1)
EOS PCT: 1 % (ref 0–5)
Eosinophils Absolute: 0.1 10*3/uL (ref 0.0–0.7)
HCT: 29.8 % — ABNORMAL LOW (ref 39.0–52.0)
Hemoglobin: 9.7 g/dL — ABNORMAL LOW (ref 13.0–17.0)
Lymphocytes Relative: 11 % — ABNORMAL LOW (ref 12–46)
Lymphs Abs: 1.2 10*3/uL (ref 0.7–4.0)
MCH: 28.5 pg (ref 26.0–34.0)
MCHC: 32.6 g/dL (ref 30.0–36.0)
MCV: 87.6 fL (ref 78.0–100.0)
Monocytes Absolute: 1.1 10*3/uL — ABNORMAL HIGH (ref 0.1–1.0)
Monocytes Relative: 10 % (ref 3–12)
NEUTROS ABS: 8.4 10*3/uL — AB (ref 1.7–7.7)
Neutrophils Relative %: 78 % — ABNORMAL HIGH (ref 43–77)
PLATELETS: 439 10*3/uL — AB (ref 150–400)
RBC: 3.4 MIL/uL — ABNORMAL LOW (ref 4.22–5.81)
RDW: 14.4 % (ref 11.5–15.5)
WBC: 10.7 10*3/uL — AB (ref 4.0–10.5)

## 2014-05-17 MED ORDER — SODIUM CHLORIDE 0.9 % IV SOLN
1.0000 g | INTRAVENOUS | Status: DC
  Administered 2014-05-17 – 2014-05-25 (×9): 1 g via INTRAVENOUS
  Filled 2014-05-17 (×9): qty 1

## 2014-05-17 MED ORDER — ACETAMINOPHEN 650 MG RE SUPP: 650.0000 mg | Freq: Four times a day (QID) | RECTAL | Status: DC | PRN

## 2014-05-17 MED ORDER — ACETAMINOPHEN 325 MG PO TABS
650.0000 mg | ORAL_TABLET | Freq: Four times a day (QID) | ORAL | Status: DC | PRN
  Administered 2014-05-18 – 2014-05-22 (×7): 650 mg via ORAL
  Filled 2014-05-17 (×7): qty 2

## 2014-05-17 MED ORDER — OXYCODONE HCL 5 MG PO TABS
5.0000 mg | ORAL_TABLET | ORAL | Status: DC | PRN
  Administered 2014-05-17 – 2014-05-19 (×9): 10 mg via ORAL
  Filled 2014-05-17 (×9): qty 2

## 2014-05-17 MED ORDER — DIPHENHYDRAMINE HCL 12.5 MG/5ML PO ELIX
12.5000 mg | ORAL_SOLUTION | Freq: Four times a day (QID) | ORAL | Status: DC | PRN
  Administered 2014-05-23 (×2): 25 mg via ORAL
  Filled 2014-05-17 (×2): qty 10

## 2014-05-17 MED ORDER — ONDANSETRON HCL 4 MG/2ML IJ SOLN: 4.0000 mg | Freq: Four times a day (QID) | INTRAMUSCULAR | Status: DC | PRN

## 2014-05-17 MED ORDER — IOHEXOL 300 MG/ML  SOLN
100.0000 mL | Freq: Once | INTRAMUSCULAR | Status: AC | PRN
  Administered 2014-05-17: 100 mL via INTRAVENOUS

## 2014-05-17 MED ORDER — MORPHINE SULFATE 2 MG/ML IJ SOLN
2.0000 mg | INTRAMUSCULAR | Status: DC | PRN
  Administered 2014-05-17 – 2014-05-18 (×4): 2 mg via INTRAVENOUS
  Filled 2014-05-17 (×4): qty 1

## 2014-05-17 MED ORDER — POTASSIUM CHLORIDE IN NACL 20-0.9 MEQ/L-% IV SOLN
INTRAVENOUS | Status: DC
  Administered 2014-05-17 – 2014-05-23 (×10): via INTRAVENOUS
  Administered 2014-05-24: 75 mL/h via INTRAVENOUS
  Filled 2014-05-17 (×14): qty 1000

## 2014-05-17 MED ORDER — ENOXAPARIN SODIUM 30 MG/0.3ML ~~LOC~~ SOLN
30.0000 mg | Freq: Two times a day (BID) | SUBCUTANEOUS | Status: DC
  Administered 2014-05-17 – 2014-05-25 (×16): 30 mg via SUBCUTANEOUS
  Filled 2014-05-17 (×18): qty 0.3

## 2014-05-17 MED ORDER — DIPHENHYDRAMINE HCL 50 MG/ML IJ SOLN
12.5000 mg | Freq: Four times a day (QID) | INTRAMUSCULAR | Status: DC | PRN
  Administered 2014-05-20: 12.5 mg via INTRAVENOUS
  Administered 2014-05-20 – 2014-05-21 (×2): 25 mg via INTRAVENOUS
  Filled 2014-05-17 (×3): qty 1

## 2014-05-17 NOTE — Progress Notes (Signed)
Jeff Hoffman 05/17/2014 10:32 AM Location: [facility] Patient #: [id number] DOB: 12/03/1970 Single / Language: [place] / Race: White Male History of Present Illness Jeff Hoffman([name] MD; 05/17/2014 1:21 PM) Patient words: S/p ruptured appendix.  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 He was discharged from the hospital May 4. He's been on oral Augmentin. He has today she noticed some pinkness and swelling around her small right upper quadrant incision. This had worsened overnight and he ran a fever of 102.5. He's been having some sweats as well and does not feel as well today. He is eating some. His last bowel movement was yesterday.  Exam: General- He appears ill. Abd-soft, right upper quadrant incision has erythema and swelling around it with tenderness. Subumbilical and left lower quadrant incisions are clean.  After sterilely cleaning and anesthetizing the right upper quadrant incision with Xylocaine, incision was opened and a large amount of pus was drained. I extended the incision and made a cruciate incision. Quarter-inch iodoform was then packed into the wound.  Other Problems Jeff Hoffman(Bernie Morris, CMA; 05/17/2014 10:32 AM) Alcohol Abuse Asthma Atrial Fibrillation Back Pain Gastroesophageal Reflux Disease Inguinal Hernia  Past Surgical History Jeff Hoffman(Bernie Morris, CMA; 05/17/2014 10:32 AM) Appendectomy Foot Surgery Left. Knee Surgery Right. Open Inguinal Hernia Surgery Right.  Diagnostic Studies History Jeff Hoffman(Bernie Morris, [place]; 05/17/2014 10:32 AM) Colonoscopy never  Allergies Jeff Hoffman(Bernie Morris, CMA; 05/17/2014 10:36 AM) Erythromycin Estolate *CHEMICALS*  Medication History Jeff Hoffman(Bernie Morris, CMA; 05/17/2014 10:36 AM) Sudafed (60MG  Tablet, Oral  as directed) Active. Medications Reconciled Acyclovir (400MG  Tablet, Oral) Active. Azelastine HCl (0.1% Solution, Nasal) Active. Montelukast Sodium (10MG  Tablet, Oral) Active. Qvar (80MCG/ACT Aerosol Soln, Inhalation) Active. Truvada (200-300MG  Tablet, Oral) Active. Omeprazole (40MG  Capsule DR, Oral) Active. Clarithromycin (500MG  Tablet, Oral) Active. Amoxicillin-Pot Clavulanate (875-125MG  Tablet, Oral) Active.  Social History Jeff Hoffman(Bernie Morris, CMA; 05/17/2014 10:32 AM) Alcohol use Remotely quit alcohol use. Caffeine use Coffee, Tea. Illicit drug use Remotely quit drug use. Tobacco use Former smoker.     Review of Systems [name](Bernie Morris CMA; 05/17/2014 10:32 AM) General Present- Chills, Fatigue, Fever and Night Sweats. Not Present- Appetite Loss, Weight Gain and Weight Loss. Skin Present- Non-Healing Wounds. Not Present- Change in Wart/Mole, Dryness, Hives, Jaundice, New Lesions, Rash and Ulcer. HEENT Present- Sore Throat. Not Present- Earache, Hearing Loss, Hoarseness, Nose Bleed, Oral Ulcers, Ringing in the Ears, Seasonal Allergies, Sinus Pain, Visual Disturbances, Wears glasses/contact lenses and Yellow Eyes. Respiratory Present- Difficulty Breathing. Not Present- Bloody sputum, Chronic Cough, Snoring and Wheezing. Gastrointestinal Present- Abdominal Pain, Bloating and Constipation. Not Present- Bloody Stool, Change in Bowel Habits, Chronic diarrhea, Difficulty Swallowing, Excessive gas, Gets full quickly at meals, Hemorrhoids, Indigestion, Nausea, Rectal Pain and Vomiting. Male Genitourinary Present- Painful Urination. Not Present- Blood in Urine, Change in Urinary Stream, Frequency, Impotence, Nocturia, Urgency and Urine Leakage. Musculoskeletal Present- Back Pain. Not Present- Joint Pain, Joint Stiffness, Muscle Pain, Muscle Weakness and Swelling of Extremities.  Vitals [name](Bernie Morris CMA; 05/17/2014 10:37 AM) 05/17/2014 10:37 AM Weight: 206.6 lb Height: 73in Body  Surface Area: 2.2 m Body Mass Index: 27.26 kg/m Temp.: 97.31F(Oral)  Pulse: 92 (Regular)  Resp.: 18 (Unlabored)  BP: 138/68 (Sitting, Left Arm, Standard)     Assessment & Plan Jeff Hoffman(Tatanisha Cuthbert J. Mycah Mcdougall MD; 05/17/2014 1:22 PM)  ACUTE APPENDICITIS WITH RUPTURE (540.0  K35.2)  Current Plans  Started Augmentin 875-125MG , 1 (one) Tablet every twelve hours, #14, 05/17/2014, No Refill. Patient was instructed to follow-up in one week. Pt Education - [facility] Free Text Education/Instructions: discussed with patient and provided information. S/P LAPAROSCOPIC APPENDECTOMY (V45.89  Z90.49)  WOUND INFECTION (958.3  T14.8) Impression: This was drained. This is likely the cause of his fever and discomfort but will need to rule out an intra-abdominal abscess as well. Home health will be arranged for dressing changes if he is not admitted to the hospital.  Plan: CT of abdomen and pelvis today. If it is positive for intra-abdominal abscess, he will need to be admitted and have a percutaneous drain placed. We will continue on his oral Augmentin. Return visit one week.  Avel Peaceodd Cahterine Heinzel, MD

## 2014-05-17 NOTE — Progress Notes (Signed)
  CT reviewed with rad.   Hepatic flexure and pelvic abscess.  Spoke with IR.  Plan for IR drain placement tomorrow.  I spoke with the patient.  Admit, place on Invanz since he was on zosyn before.  May have regular diet, NPO after MN.  Check BMP and CBC.  Repeat labs in AM, IVF, pain control.   Dorothey Oetken, ANP-BC

## 2014-05-17 NOTE — Addendum Note (Signed)
Addended by: Adolph PollackOSENBOWER, Abelardo Seidner J on: 05/17/2014 11:09 AM   Modules accepted: Orders

## 2014-05-18 ENCOUNTER — Inpatient Hospital Stay (HOSPITAL_COMMUNITY): Payer: BLUE CROSS/BLUE SHIELD

## 2014-05-18 LAB — BASIC METABOLIC PANEL
Anion gap: 10 (ref 5–15)
BUN: 18 mg/dL (ref 6–20)
CO2: 28 mmol/L (ref 22–32)
Calcium: 8.3 mg/dL — ABNORMAL LOW (ref 8.9–10.3)
Chloride: 94 mmol/L — ABNORMAL LOW (ref 101–111)
Creatinine, Ser: 0.96 mg/dL (ref 0.61–1.24)
GFR calc Af Amer: >60 mL/min (ref >60)
GFR calc non Af Amer: >60 mL/min (ref >60)
GLUCOSE: 106 mg/dL — AB (ref 65–99)
POTASSIUM: 4.4 mmol/L (ref 3.5–5.1)
Sodium: 132 mmol/L — ABNORMAL LOW (ref 135–145)

## 2014-05-18 LAB — CBC
HCT: 27.1 % — ABNORMAL LOW (ref 39.0–52.0)
Hemoglobin: 8.7 g/dL — ABNORMAL LOW (ref 13.0–17.0)
MCH: 28 pg (ref 26.0–34.0)
MCHC: 32.1 g/dL (ref 30.0–36.0)
MCV: 87.1 fL (ref 78.0–100.0)
PLATELETS: 426 10*3/uL — AB (ref 150–400)
RBC: 3.11 MIL/uL — ABNORMAL LOW (ref 4.22–5.81)
RDW: 14.4 % (ref 11.5–15.5)
WBC: 7.9 10*3/uL (ref 4.0–10.5)

## 2014-05-18 LAB — PROTIME-INR
INR: 1.23 (ref 0.00–1.49)
Prothrombin Time: 15.6 seconds — ABNORMAL HIGH (ref 11.6–15.2)

## 2014-05-18 MED ORDER — PANTOPRAZOLE SODIUM 40 MG PO TBEC
40.0000 mg | DELAYED_RELEASE_TABLET | Freq: Every day | ORAL | Status: DC
  Administered 2014-05-18 – 2014-05-25 (×8): 40 mg via ORAL
  Filled 2014-05-18 (×8): qty 1

## 2014-05-18 MED ORDER — EMTRICITABINE-TENOFOVIR DF 200-300 MG PO TABS
1.0000 | ORAL_TABLET | Freq: Every day | ORAL | Status: DC
  Administered 2014-05-18 – 2014-05-25 (×8): 1 via ORAL
  Filled 2014-05-18 (×8): qty 1

## 2014-05-18 MED ORDER — DOCUSATE SODIUM 100 MG PO CAPS
100.0000 mg | ORAL_CAPSULE | Freq: Two times a day (BID) | ORAL | Status: DC
  Administered 2014-05-18 – 2014-05-25 (×15): 100 mg via ORAL
  Filled 2014-05-18 (×17): qty 1

## 2014-05-18 MED ORDER — FLUTICASONE PROPIONATE HFA 44 MCG/ACT IN AERO
2.0000 | INHALATION_SPRAY | Freq: Two times a day (BID) | RESPIRATORY_TRACT | Status: DC
  Administered 2014-05-18 – 2014-05-25 (×15): 2 via RESPIRATORY_TRACT
  Filled 2014-05-18 (×2): qty 10.6

## 2014-05-18 MED ORDER — MONTELUKAST SODIUM 10 MG PO TABS
10.0000 mg | ORAL_TABLET | Freq: Every day | ORAL | Status: DC
Start: 2014-05-18 — End: 2014-05-25
  Administered 2014-05-18 – 2014-05-25 (×8): 10 mg via ORAL
  Filled 2014-05-18 (×8): qty 1

## 2014-05-18 MED ORDER — FENTANYL CITRATE (PF) 100 MCG/2ML IJ SOLN
INTRAMUSCULAR | Status: AC | PRN
  Administered 2014-05-18: 50 ug via INTRAVENOUS
  Administered 2014-05-18 (×2): 25 ug via INTRAVENOUS

## 2014-05-18 MED ORDER — MIDAZOLAM HCL 2 MG/2ML IJ SOLN
INTRAMUSCULAR | Status: AC | PRN
  Administered 2014-05-18 (×5): 1 mg via INTRAVENOUS

## 2014-05-18 MED ORDER — MAGNESIUM 200 MG PO TABS: 1.0000 | ORAL_TABLET | Freq: Every day | ORAL | Status: DC

## 2014-05-18 MED ORDER — ALBUTEROL SULFATE (2.5 MG/3ML) 0.083% IN NEBU
2.5000 mg | INHALATION_SOLUTION | Freq: Four times a day (QID) | RESPIRATORY_TRACT | Status: DC | PRN
  Administered 2014-05-19 – 2014-05-20 (×2): 2.5 mg via RESPIRATORY_TRACT
  Filled 2014-05-18 (×3): qty 3

## 2014-05-18 MED ORDER — FLUTICASONE PROPIONATE 50 MCG/ACT NA SUSP
2.0000 | Freq: Two times a day (BID) | NASAL | Status: DC
  Filled 2014-05-18: qty 16

## 2014-05-18 MED ORDER — MORPHINE SULFATE 2 MG/ML IJ SOLN
2.0000 mg | INTRAMUSCULAR | Status: DC | PRN
  Administered 2014-05-18 – 2014-05-21 (×24): 4 mg via INTRAVENOUS
  Administered 2014-05-22: 2 mg via INTRAVENOUS
  Administered 2014-05-22: 4 mg via INTRAVENOUS
  Administered 2014-05-22: 2 mg via INTRAVENOUS
  Administered 2014-05-22 (×2): 4 mg via INTRAVENOUS
  Administered 2014-05-22 (×2): 2 mg via INTRAVENOUS
  Administered 2014-05-23 – 2014-05-25 (×14): 4 mg via INTRAVENOUS
  Filled 2014-05-18 (×17): qty 2
  Filled 2014-05-18: qty 1
  Filled 2014-05-18 (×2): qty 2
  Filled 2014-05-18: qty 1
  Filled 2014-05-18 (×3): qty 2
  Filled 2014-05-18: qty 1
  Filled 2014-05-18 (×8): qty 2
  Filled 2014-05-18: qty 1
  Filled 2014-05-18 (×12): qty 2

## 2014-05-18 MED ORDER — MIDAZOLAM HCL 2 MG/2ML IJ SOLN
INTRAMUSCULAR | Status: AC
  Filled 2014-05-18: qty 6

## 2014-05-18 MED ORDER — MAGNESIUM OXIDE 400 (241.3 MG) MG PO TABS
200.0000 mg | ORAL_TABLET | Freq: Every day | ORAL | Status: DC
  Administered 2014-05-18 – 2014-05-25 (×8): 200 mg via ORAL
  Filled 2014-05-18 (×8): qty 0.5

## 2014-05-18 MED ORDER — AZELASTINE HCL 0.1 % NA SOLN
1.0000 | Freq: Two times a day (BID) | NASAL | Status: DC
  Filled 2014-05-18: qty 30

## 2014-05-18 MED ORDER — FENTANYL CITRATE (PF) 100 MCG/2ML IJ SOLN
INTRAMUSCULAR | Status: AC
  Filled 2014-05-18: qty 4

## 2014-05-18 MED ORDER — POLYETHYLENE GLYCOL 3350 17 G PO PACK
17.0000 g | PACK | Freq: Every day | ORAL | Status: DC
  Administered 2014-05-19 – 2014-05-25 (×7): 17 g via ORAL
  Filled 2014-05-18 (×7): qty 1

## 2014-05-18 NOTE — Progress Notes (Signed)
Pt reported having been constipated for several days. Pt stated he had two small BMs last night but had to strain some. Pt would like for MD to address today.

## 2014-05-18 NOTE — Progress Notes (Signed)
Subjective: Request received from [facility] for CT guided drainage of abdominal/pelvic abscesses in patient status post laparoscopic appendectomy on 05/05/14. At the time of surgery appendix was perforated with feculent peritonitis. He was discharged from hospital on 05/10/14 and readmitted on 05/17/14 with draining wound from previous right upper quadrant incision site. Patient had I&D of the incision site and Dr. Maris Bergerosenbower's office prior to admission. Patient still complains of abdominal pain with intermittent nausea. CT on 05/17/14 revealed peritoneal abscess beneath hepatic flexure appearing to communicate between the medial and lateral pocket, and posterior cul-de-sac abscess.   Objective: Vital signs in last 24 hours: Temp:  [97.5 F (36.4 C)-99.8 F (37.7 C)] 98.5 F (36.9 C) (05/12 0619) Pulse Rate:  [66-93] 66 (05/12 0619) Resp:  [16] 16 (05/12 0619) BP: (121-146)/(55-62) 121/57 mmHg (05/12 0619) SpO2:  [98 %-100 %] 100 % (05/12 0619) Weight:  [201 lb (91.173 kg)] 201 lb (91.173 kg) (05/11 1800) Last BM Date: 05/17/14  Intake/Output from previous day: 05/11 0701 - 05/12 0700 In: 911.3 [I.V.:911.3] Out: 325 [Urine:325] Intake/Output this shift: Total I/O In: 0  Out: 600 [Urine:600]  Patient awake, alert. Chest slightly diminished breath sounds right base, left clear. Heart with regular rate and rhythm. Abdomen soft, diffusely tender to palpation, hyperactive bowel sounds, draining wound right mid to upper abdominal region; extremities with no edema Lab Results:   Recent Labs  05/17/14 1711 05/18/14 0454  WBC 10.7* 7.9  HGB 9.7* 8.7*  HCT 29.8* 27.1*  PLT 439* 426*   BMET  Recent Labs  05/17/14 1711 05/18/14 0454  NA 132* 132*  K 4.2 4.4  CL 94* 94*  CO2 27 28  GLUCOSE 101* 106*  BUN 21* 18  CREATININE 0.87 0.96  CALCIUM 8.5* 8.3*   PT/INR No results for input(s): LABPROT, INR in the last 72 hours. ABG No results for input(s): PHART, HCO3 in the last 72  hours.  Invalid input(s): PCO2, PO2  Studies/Results: Ct Abdomen Pelvis W Contrast  05/17/2014   CLINICAL DATA:  Patient status post laparoscopic appendectomy on 05/05/2014. Surgical drain removed 1 week prior. Now with fever and pain.  EXAM: CT ABDOMEN AND PELVIS WITH CONTRAST  TECHNIQUE: Multidetector CT imaging of the abdomen and pelvis was performed using the standard protocol following bolus administration of intravenous contrast.  CONTRAST:  100mL OMNIPAQUE IOHEXOL 300 MG/ML  SOLN  COMPARISON:  CT 05/05/2014  FINDINGS: Lower chest:  Atelectasis in the right lung base.  No consolidation.  Hepatobiliary: No focal hepatic lesion. There is mild stranding within the gallbladder fossa. Tiny wall adherent calcification within the gallbladder measuring 1 mm image 40, series 3.  Pancreas: Pancreas is normal. No ductal dilatation. No pancreatic inflammation.  Spleen: Normal spleen  Adrenals/urinary tract: Adrenal glands and kidneys are normal. The ureters and bladder normal.  Stomach/Bowel: The stomach and small bowel are normal. Patient status post appendectomy. There is inflammation along the right pericolic gutter anterior to the ascending colon consistent recent surgery. There is a fluid collection extending beneath the hepatic flexure laterally and medially. There is a lateral pocket measuring 3.5 x 2.2 cm and a medial pocket measuring 5.1 by 3.4 cm. These 2 collections appear to connect medially beneath the hepatic flexure.  There is a second fluid collection in the posterior cul-de-sac measuring 6.6 x 3.7 cm.  There is no evidence of bowel obstruction or leak. Contrast transits to the descending colon.  Vascular/Lymphatic: Abdominal aorta is normal caliber. Aortocaval lymph node measures 8 mm on  image 46, series 3. Ileocecal mesenteric lymph node measures 8 mm 13 mm.  Reproductive: Prostate is normal.  Musculoskeletal: No aggressive osseous lesion.  Other: Mild inflammation along the right upper quadrant  laparoscopic port site.  IMPRESSION: 1. Post appendectomy peritoneal abscess. Elongated abscess beneath the hepatic flexure appears to communicate between a medial and lateral pocket. 2. Second abscess in the the posterior cul-de-sac. 3. Mild inflammation in the gallbladder fossa the secondary to abscess described above. 4. Mild inflammation along the right upper quadrant laparoscopic port site. 5. No evidence of bowel obstruction or leak of oral contrast. 6. Mild mesenteric retroperitoneal adenopathy consistent with abdominal infection. Findings conveyed to Irmina  Rebac PA on 05/17/2014  at16:16.   Electronically Signed   By: Genevive BiStewart  Edmunds M.D.   On: 05/17/2014 16:18    Anti-infectives: Anti-infectives    Start     Dose/Rate Route Frequency Ordered Stop   05/18/14 1200  emtricitabine-tenofovir (TRUVADA) 200-300 MG per tablet 1 tablet     1 tablet Oral Daily 05/18/14 0919     05/17/14 1800  ertapenem (INVANZ) 1 g in sodium chloride 0.9 % 50 mL IVPB     1 g 100 mL/hr over 30 Minutes Intravenous Every 24 hours 05/17/14 1626        Assessment/Plan: s/p laparoscopic appendectomy on 05/05/14; now with draining wound right upper quadrant and evidence of peritoneal cul-de-sac abscesses on CT; patient currently afebrile and WBC 7.9, hemoglobin 8.7. Request received for CT guided drainage of abdominal/pelvic abscesses. Imaging studies have been reviewed by Dr. Bonnielee HaffHoss. Details/risks of procedure, including but not limited to, internal bleeding, infection, injury to adjacent organs, inability to eradicate infection and need for additional surgery, discussed with patient and mother with their understanding and consent. Procedure planned for later today.  LOS: 1 day   15 minutes were spent evaluating patient for CT-guided abscess drainage ALLRED,[facility] 05/18/2014

## 2014-05-18 NOTE — Progress Notes (Signed)
Dr. Gerrit FriendsGerkin aware via phone of constant, increased pain near new drain site. See new modification order for Morphine in EPIC. Pt reassured.

## 2014-05-18 NOTE — Procedures (Signed)
RLQ abscess drain Pus No comp

## 2014-05-18 NOTE — Progress Notes (Signed)
Patient ID: Jeff Hoffman, male   DOB: Dec 14, 1970, 44 y.o.   MRN: [id number]     [address] [place], [place] [address]    Phone: [phone] FAX: [phone]     Subjective: Rough night.  VSS.  Afebrile.  WBC down.    Objective:  Vital signs:  Filed Vitals:   05/17/14 1800 05/17/14 2200 05/18/14 0200 05/18/14 0619  BP:  144/62 146/62 121/57  Pulse:  93 86 66  Temp:  99.8 F (37.7 C) 98.7 F (37.1 C) 98.5 F (36.9 C)  TempSrc:  Oral Oral Oral  Resp:  16 16 16   Height: 6' 1"  (1.854 m)     Weight: 91.173 kg (201 lb)     SpO2:  99% 98% 100%    Last BM Date: 05/17/14  Intake/Output   Yesterday:  05/11 0701 - 05/12 0700 In: 911.3 [I.V.:911.3] Out: 325 [Urine:325] This shift:  Total I/O In: -  Out: 300 [Urine:300]   Physical Exam: General: Pt awake/alert/oriented x4 in no acute distress Chest: cta bilaterally.  No chest wall pain w good excursion CV:  Pulses intact.  Regular rhythm MS: Normal AROM mjr joints.  No obvious deformity Abdomen: Soft.  Nondistended.  Tender.  Right abd incision open draining purulent output. .  No evidence of peritonitis.  No incarcerated hernias. Ext:  SCDs BLE.  No mjr edema.  No cyanosis Skin: No petechiae / purpura   Problem List:   Active Problems:   Intra-abdominal abscess    Results:   Labs: Results for orders placed or performed during the hospital encounter of 05/17/14 (from the past 48 hour(s))  CBC with Differential/Platelet     Status: Abnormal   Collection Time: 05/17/14  5:11 PM  Result Value Ref Range   WBC 10.7 (H) 4.0 - 10.5 K/uL   RBC 3.40 (L) 4.22 - 5.81 MIL/uL   Hemoglobin 9.7 (L) 13.0 - 17.0 g/dL   HCT 29.8 (L) 39.0 - 52.0 %   MCV 87.6 78.0 - 100.0 fL   MCH 28.5 26.0 - 34.0 pg   MCHC 32.6 30.0 - 36.0 g/dL   RDW 14.4 11.5 - 15.5 %   Platelets 439 (H) 150 - 400 K/uL   Neutrophils Relative % 78 (H) 43 - 77 %   Neutro Abs 8.4 (H) 1.7 -  7.7 K/uL   Lymphocytes Relative 11 (L) 12 - 46 %   Lymphs Abs 1.2 0.7 - 4.0 K/uL   Monocytes Relative 10 3 - 12 %   Monocytes Absolute 1.1 (H) 0.1 - 1.0 K/uL   Eosinophils Relative 1 0 - 5 %   Eosinophils Absolute 0.1 0.0 - 0.7 K/uL   Basophils Relative 0 0 - 1 %   Basophils Absolute 0.0 0.0 - 0.1 K/uL  Basic metabolic panel     Status: Abnormal   Collection Time: 05/17/14  5:11 PM  Result Value Ref Range   Sodium 132 (L) 135 - 145 mmol/L   Potassium 4.2 3.5 - 5.1 mmol/L   Chloride 94 (L) 101 - 111 mmol/L   CO2 27 22 - 32 mmol/L   Glucose, Bld 101 (H) 70 - 99 mg/dL   BUN 21 (H) 6 - 20 mg/dL   Creatinine, Ser 0.87 0.61 - 1.24 mg/dL   Calcium 8.5 (L) 8.9 - 10.3 mg/dL   GFR calc non Af Amer >60 >60 mL/min   GFR calc Af Amer >  60 >60 mL/min    Comment: (NOTE) The eGFR has been calculated using the CKD EPI equation. This calculation has not been validated in all clinical situations. eGFR's persistently <60 mL/min signify possible Chronic Kidney Disease.    Anion gap 11 5 - 15  Basic metabolic panel     Status: Abnormal   Collection Time: 05/18/14  4:54 AM  Result Value Ref Range   Sodium 132 (L) 135 - 145 mmol/L   Potassium 4.4 3.5 - 5.1 mmol/L   Chloride 94 (L) 101 - 111 mmol/L   CO2 28 22 - 32 mmol/L   Glucose, Bld 106 (H) 65 - 99 mg/dL   BUN 18 6 - 20 mg/dL   Creatinine, Ser 0.96 0.61 - 1.24 mg/dL   Calcium 8.3 (L) 8.9 - 10.3 mg/dL   GFR calc non Af Amer >60 >60 mL/min   GFR calc Af Amer >60 >60 mL/min    Comment: (NOTE) The eGFR has been calculated using the CKD EPI equation. This calculation has not been validated in all clinical situations. eGFR's persistently <60 mL/min signify possible Chronic Kidney Disease.    Anion gap 10 5 - 15  CBC     Status: Abnormal   Collection Time: 05/18/14  4:54 AM  Result Value Ref Range   WBC 7.9 4.0 - 10.5 K/uL   RBC 3.11 (L) 4.22 - 5.81 MIL/uL   Hemoglobin 8.7 (L) 13.0 - 17.0 g/dL   HCT 27.1 (L) 39.0 - 52.0 %   MCV 87.1 78.0 -  100.0 fL   MCH 28.0 26.0 - 34.0 pg   MCHC 32.1 30.0 - 36.0 g/dL   RDW 14.4 11.5 - 15.5 %   Platelets 426 (H) 150 - 400 K/uL    Imaging / Studies: Ct Abdomen Pelvis W Contrast  05/17/2014   CLINICAL DATA:  Patient status post laparoscopic appendectomy on 05/05/2014. Surgical drain removed 1 week prior. Now with fever and pain.  EXAM: CT ABDOMEN AND PELVIS WITH CONTRAST  TECHNIQUE: Multidetector CT imaging of the abdomen and pelvis was performed using the standard protocol following bolus administration of intravenous contrast.  CONTRAST:  152m OMNIPAQUE IOHEXOL 300 MG/ML  SOLN  COMPARISON:  CT 05/05/2014  FINDINGS: Lower chest:  Atelectasis in the right lung base.  No consolidation.  Hepatobiliary: No focal hepatic lesion. There is mild stranding within the gallbladder fossa. Tiny wall adherent calcification within the gallbladder measuring 1 mm image 40, series 3.  Pancreas: Pancreas is normal. No ductal dilatation. No pancreatic inflammation.  Spleen: Normal spleen  Adrenals/urinary tract: Adrenal glands and kidneys are normal. The ureters and bladder normal.  Stomach/Bowel: The stomach and small bowel are normal. Patient status post appendectomy. There is inflammation along the right pericolic gutter anterior to the ascending colon consistent recent surgery. There is a fluid collection extending beneath the hepatic flexure laterally and medially. There is a lateral pocket measuring 3.5 x 2.2 cm and a medial pocket measuring 5.1 by 3.4 cm. These 2 collections appear to connect medially beneath the hepatic flexure.  There is a second fluid collection in the posterior cul-de-sac measuring 6.6 x 3.7 cm.  There is no evidence of bowel obstruction or leak. Contrast transits to the descending colon.  Vascular/Lymphatic: Abdominal aorta is normal caliber. Aortocaval lymph node measures 8 mm on image 46, series 3. Ileocecal mesenteric lymph node measures 8 mm 13 mm.  Reproductive: Prostate is normal.   Musculoskeletal: No aggressive osseous lesion.  Other: Mild inflammation along the  right upper quadrant laparoscopic port site.  IMPRESSION: 1. Post appendectomy peritoneal abscess. Elongated abscess beneath the hepatic flexure appears to communicate between a medial and lateral pocket. 2. Second abscess in the the posterior cul-de-sac. 3. Mild inflammation in the gallbladder fossa the secondary to abscess described above. 4. Mild inflammation along the right upper quadrant laparoscopic port site. 5. No evidence of bowel obstruction or leak of oral contrast. 6. Mild mesenteric retroperitoneal adenopathy consistent with abdominal infection. Findings conveyed to Irmina  Rebac PA on 05/17/2014  at16:16.   Electronically Signed   By: Suzy Bouchard M.D.   On: 05/17/2014 16:18    Medications / Allergies:  Scheduled Meds: . enoxaparin (LOVENOX) injection  30 mg Subcutaneous Q12H  . ertapenem  1 g Intravenous Q24H   Continuous Infusions: . 0.9 % NaCl with KCl 20 mEq / L 75 mL/hr at 05/18/14 0649   PRN Meds:.acetaminophen **OR** acetaminophen, diphenhydrAMINE **OR** diphenhydrAMINE, morphine injection, ondansetron, oxyCODONE  Antibiotics: Anti-infectives    Start     Dose/Rate Route Frequency Ordered Stop   05/17/14 1800  ertapenem (INVANZ) 1 g in sodium chloride 0.9 % 50 mL IVPB     1 g 100 mL/hr over 30 Minutes Intravenous Every 24 hours 05/17/14 1626          Assessment/Plan POD#13 laparoscopic appendectomy for perforated appendix with feculent peritonitis by Dr. Zella Richer Hepatic flexure and lateral abscess which appear to communicate Pelvic abscess -Invanz D#1 -NPO for IR drains placement  -mobilize -pain control Wound infection -pack BID with WIC to allow for drainage of pus  HIV exposure-c/w home meds Asthma-home meds FEN-NPO for now, resume regular diet, colace, add miralax VTE prophylaxis- SCD/lovenix  Erby Pian, ANP-BC [facility] Pager  [phone](7A-4:30P)    05/18/2014 9:12 AM

## 2014-05-18 NOTE — Progress Notes (Signed)
Order obtained via phone from Dr. Gerrit FriendsGerkin for regular diet per pt request.

## 2014-05-19 ENCOUNTER — Inpatient Hospital Stay (HOSPITAL_COMMUNITY): Payer: BLUE CROSS/BLUE SHIELD

## 2014-05-19 ENCOUNTER — Encounter (HOSPITAL_COMMUNITY): Payer: Self-pay

## 2014-05-19 LAB — CBC WITH DIFFERENTIAL/PLATELET
BASOS PCT: 0 % (ref 0–1)
Basophils Absolute: 0 10*3/uL (ref 0.0–0.1)
EOS PCT: 1 % (ref 0–5)
Eosinophils Absolute: 0.1 10*3/uL (ref 0.0–0.7)
HEMATOCRIT: 31.6 % — AB (ref 39.0–52.0)
HEMOGLOBIN: 10.3 g/dL — AB (ref 13.0–17.0)
Lymphocytes Relative: 16 % (ref 12–46)
Lymphs Abs: 1.1 10*3/uL (ref 0.7–4.0)
MCH: 28.3 pg (ref 26.0–34.0)
MCHC: 32.6 g/dL (ref 30.0–36.0)
MCV: 86.8 fL (ref 78.0–100.0)
Monocytes Absolute: 0.7 10*3/uL (ref 0.1–1.0)
Monocytes Relative: 10 % (ref 3–12)
NEUTROS PCT: 73 % (ref 43–77)
Neutro Abs: 5.2 10*3/uL (ref 1.7–7.7)
PLATELETS: 526 10*3/uL — AB (ref 150–400)
RBC: 3.64 MIL/uL — ABNORMAL LOW (ref 4.22–5.81)
RDW: 14.3 % (ref 11.5–15.5)
WBC: 7.2 10*3/uL (ref 4.0–10.5)

## 2014-05-19 MED ORDER — MIDAZOLAM HCL 2 MG/2ML IJ SOLN
INTRAMUSCULAR | Status: AC
  Filled 2014-05-19: qty 6

## 2014-05-19 MED ORDER — KETOROLAC TROMETHAMINE 15 MG/ML IJ SOLN: 15.0000 mg | Freq: Four times a day (QID) | INTRAMUSCULAR | Status: DC

## 2014-05-19 MED ORDER — MIDAZOLAM HCL 2 MG/2ML IJ SOLN
INTRAMUSCULAR | Status: AC | PRN
  Administered 2014-05-19 (×2): 1 mg via INTRAVENOUS
  Administered 2014-05-19: 0.5 mg via INTRAVENOUS

## 2014-05-19 MED ORDER — OXYCODONE HCL 5 MG PO TABS
10.0000 mg | ORAL_TABLET | ORAL | Status: DC | PRN
  Administered 2014-05-19: 15 mg via ORAL
  Administered 2014-05-19 – 2014-05-23 (×23): 20 mg via ORAL
  Administered 2014-05-24: 10 mg via ORAL
  Administered 2014-05-24 (×4): 20 mg via ORAL
  Administered 2014-05-24 – 2014-05-25 (×5): 15 mg via ORAL
  Filled 2014-05-19 (×3): qty 4
  Filled 2014-05-19: qty 3
  Filled 2014-05-19 (×9): qty 4
  Filled 2014-05-19: qty 3
  Filled 2014-05-19 (×11): qty 4
  Filled 2014-05-19: qty 3
  Filled 2014-05-19 (×4): qty 4
  Filled 2014-05-19: qty 3
  Filled 2014-05-19: qty 4
  Filled 2014-05-19 (×2): qty 3

## 2014-05-19 MED ORDER — FENTANYL CITRATE (PF) 100 MCG/2ML IJ SOLN
INTRAMUSCULAR | Status: AC | PRN
  Administered 2014-05-19: 25 ug via INTRAVENOUS
  Administered 2014-05-19: 50 ug via INTRAVENOUS
  Administered 2014-05-19: 25 ug via INTRAVENOUS

## 2014-05-19 MED ORDER — FENTANYL CITRATE (PF) 100 MCG/2ML IJ SOLN
INTRAMUSCULAR | Status: AC
  Filled 2014-05-19: qty 4

## 2014-05-19 NOTE — Progress Notes (Signed)
Patient ID: Jeff Hoffman, male   DOB: [birth year], 44 y.o.   MRN: [id number]     [facility]      [address] [place], [place] [address]    Phone: [phone] FAX: [phone]     Subjective: Pain.  Passing flatus.  No n/v.  VSS.  Afebrile.   Objective:  Vital signs:  Filed Vitals:   05/18/14 2156 05/19/14 0529 05/19/14 0932 05/19/14 1058  BP: 133/65 131/63  141/70  Pulse: 88 77  90  Temp: 98.6 F (37 C) 98 F (36.7 C)  98.3 F (36.8 C)  TempSrc: Oral Oral  Oral  Resp: 16 18  18   Height:      Weight:      SpO2: 98% 98% 95% 98%    Last BM Date:  (PTA)  Intake/Output   Yesterday:  05/12 8403 - 05/13 0700 In: 1925 [P.O.:120; I.V.:1800] Out: 3285 [Urine:3250; Drains:35] This shift:    I/O last 3 completed shifts: In: 2836.3 [P.O.:120; I.V.:2711.3; Other:5] Out: 7543 [KGOVP:0340; Drains:35]    Physical Exam: General: Pt awake/alert/oriented x4 in no acute distress Chest: cta bilaterally. No chest wall pain w good excursion CV: Pulses intact. Regular rhythm MS: Normal AROM mjr joints. No obvious deformity Abdomen: Soft. Nondistended. Tender. Right abd incision open draining purulent output.  RLQ drain with purulent output. No evidence of peritonitis. No incarcerated hernias. Ext: SCDs BLE. No mjr edema. No cyanosis Skin: No petechiae / purpura    Problem List:   Active Problems:   Intra-abdominal abscess    Results:   Labs: Results for orders placed or performed during the hospital encounter of 05/17/14 (from the past 48 hour(s))  CBC with Differential/Platelet     Status: Abnormal   Collection Time: 05/17/14  5:11 PM  Result Value Ref Range   WBC 10.7 (H) 4.0 - 10.5 K/uL   RBC 3.40 (L) 4.22 - 5.81 MIL/uL   Hemoglobin 9.7 (L) 13.0 - 17.0 g/dL   HCT 29.8 (L) 39.0 - 52.0 %   MCV 87.6 78.0 - 100.0 fL   MCH 28.5 26.0 - 34.0 pg   MCHC 32.6 30.0 - 36.0 g/dL   RDW 14.4 11.5 - 15.5 %   Platelets 439 (H) 150 - 400 K/uL   Neutrophils Relative % 78 (H) 43 - 77 %   Neutro Abs 8.4 (H) 1.7 - 7.7 K/uL   Lymphocytes Relative 11 (L) 12 - 46 %   Lymphs Abs 1.2 0.7 - 4.0 K/uL   Monocytes Relative 10 3 - 12 %   Monocytes Absolute 1.1 (H) 0.1 - 1.0 K/uL   Eosinophils Relative 1 0 - 5 %   Eosinophils Absolute 0.1 0.0 - 0.7 K/uL   Basophils Relative 0 0 - 1 %   Basophils Absolute 0.0 0.0 - 0.1 K/uL  Basic metabolic panel     Status: Abnormal   Collection Time: 05/17/14  5:11 PM  Result Value Ref Range   Sodium 132 (L) 135 - 145 mmol/L   Potassium 4.2 3.5 - 5.1 mmol/L   Chloride 94 (L) 101 - 111 mmol/L   CO2 27 22 - 32 mmol/L   Glucose, Bld 101 (H) 70 - 99 mg/dL   BUN 21 (H) 6 - 20 mg/dL   Creatinine, Ser 0.87 0.61 - 1.24 mg/dL   Calcium 8.5 (L) 8.9 - 10.3 mg/dL   GFR calc non Af Amer >60 >60 mL/min   GFR calc Af Amer >  60 >60 mL/min    Comment: (NOTE) The eGFR has been calculated using the CKD EPI equation. This calculation has not been validated in all clinical situations. eGFR's persistently <60 mL/min signify possible Chronic Kidney Disease.    Anion gap 11 5 - 15  Basic metabolic panel     Status: Abnormal   Collection Time: 05/18/14  4:54 AM  Result Value Ref Range   Sodium 132 (L) 135 - 145 mmol/L   Potassium 4.4 3.5 - 5.1 mmol/L   Chloride 94 (L) 101 - 111 mmol/L   CO2 28 22 - 32 mmol/L   Glucose, Bld 106 (H) 65 - 99 mg/dL   BUN 18 6 - 20 mg/dL   Creatinine, Ser 0.96 0.61 - 1.24 mg/dL   Calcium 8.3 (L) 8.9 - 10.3 mg/dL   GFR calc non Af Amer >60 >60 mL/min   GFR calc Af Amer >60 >60 mL/min    Comment: (NOTE) The eGFR has been calculated using the CKD EPI equation. This calculation has not been validated in all clinical situations. eGFR's persistently <60 mL/min signify possible Chronic Kidney Disease.    Anion gap 10 5 - 15  CBC     Status: Abnormal   Collection Time: 05/18/14  4:54 AM  Result Value Ref Range   WBC 7.9 4.0 - 10.5 K/uL   RBC 3.11 (L)  4.22 - 5.81 MIL/uL   Hemoglobin 8.7 (L) 13.0 - 17.0 g/dL   HCT 27.1 (L) 39.0 - 52.0 %   MCV 87.1 78.0 - 100.0 fL   MCH 28.0 26.0 - 34.0 pg   MCHC 32.1 30.0 - 36.0 g/dL   RDW 14.4 11.5 - 15.5 %   Platelets 426 (H) 150 - 400 K/uL  Protime-INR     Status: Abnormal   Collection Time: 05/18/14  1:00 PM  Result Value Ref Range   Prothrombin Time 15.6 (H) 11.6 - 15.2 seconds   INR 1.23 0.00 - 1.49  Body fluid culture     Status: None (Preliminary result)   Collection Time: 05/18/14  3:02 PM  Result Value Ref Range   Specimen Description ABSCESS    Special Requests NONE    Gram Stain      ABUNDANT WBC PRESENT, PREDOMINANTLY PMN MODERATE GRAM POSITIVE COCCI IN CLUSTERS FEW GRAM NEGATIVE RODS Performed at [facility]    Culture PENDING    Report Status PENDING     Imaging / Studies: Ct Abdomen Pelvis W Contrast  05/18/2014   CLINICAL DATA:  Patient status post laparoscopic appendectomy on 05/05/2014. Surgical drain removed 1 week prior. Now with fever and pain.  EXAM: CT ABDOMEN AND PELVIS WITH CONTRAST  TECHNIQUE: Multidetector CT imaging of the abdomen and pelvis was performed using the standard protocol following bolus administration of intravenous contrast.  CONTRAST:  153m OMNIPAQUE IOHEXOL 300 MG/ML  SOLN  COMPARISON:  CT 05/05/2014  FINDINGS: Lower chest:  Atelectasis in the right lung base.  No consolidation.  Hepatobiliary: No focal hepatic lesion. There is mild stranding within the gallbladder fossa. Tiny wall adherent calcification within the gallbladder measuring 1 mm image 40, series 3.  Pancreas: Pancreas is normal. No ductal dilatation. No pancreatic inflammation.  Spleen: Normal spleen  Adrenals/urinary tract: Adrenal glands and kidneys are normal. The ureters and bladder normal.  Stomach/Bowel: The stomach and small bowel are normal. Patient status post appendectomy. There is inflammation along the right pericolic gutter anterior to the ascending colon consistent recent  surgery. There is a  fluid collection extending beneath the hepatic flexure laterally and medially. There is a lateral pocket measuring 3.5 x 2.2 cm and a medial pocket measuring 5.1 by 3.4 cm. These 2 collections appear to connect medially beneath the hepatic flexure.  There is a second fluid collection in the posterior cul-de-sac measuring 6.6 x 3.7 cm.  There is no evidence of bowel obstruction or leak. Contrast transits to the descending colon.  Vascular/Lymphatic: Abdominal aorta is normal caliber. Aortocaval lymph node measures 8 mm on image 46, series 3. Ileocecal mesenteric lymph node measures 8 mm 13 mm.  Reproductive: Prostate is normal.  Musculoskeletal: No aggressive osseous lesion.  Other: Mild inflammation along the right upper quadrant laparoscopic port site.  IMPRESSION: 1. Post appendectomy peritoneal abscess. Elongated abscess beneath the hepatic flexure appears to communicate between a medial and lateral pocket. 2. Second abscess in the the posterior cul-de-sac. 3. Mild inflammation in the gallbladder fossa the secondary to abscess described above. 4. Mild inflammation along the right upper quadrant laparoscopic port site. 5. No evidence of bowel obstruction or leak of oral contrast. 6. Mild mesenteric retroperitoneal adenopathy consistent with abdominal infection. Findings conveyed to Irmina  Rebac PA on 05/17/2014  at16:16.   Electronically Signed   By: Suzy Bouchard M.D.   On: 05/17/2014 16:18   Ct Image Guided Drainage Percut Cath  Peritoneal Retroperit  05/18/2014   CLINICAL DATA:  Appendicitis.  Abscess.  EXAM: CT-GUIDED ABSCESS DRAINAGE  FLUOROSCOPY TIME:  None  MEDICATIONS AND MEDICAL HISTORY: Versed 5 mg, Fentanyl 100 mcg.  Additional Medications: None.  ANESTHESIA/SEDATION: Moderate sedation time: 19 minutes  CONTRAST:  None  PROCEDURE: The procedure, risks, benefits, and alternatives were explained to the patient. Questions regarding the procedure were encouraged and answered. The  patient understands and consents to the procedure.  The right lower quadrant was prepped with Betadine in a sterile fashion, and a sterile drape was applied covering the operative field. A sterile gown and sterile gloves were used for the procedure.  Under CT guidance, an 18 gauge needle was inserted into the right lower quadrant abscess. It was removed over an Amplatz wire. A 12 French dilator followed by a 12 [place] drain were inserted. It was looped and string fixed. Pus was aspirated. It was sewn to the skin.  FINDINGS: Images document 16 French drain placement into a right lower quadrant abscess.  COMPLICATIONS: None  IMPRESSION: Successful 12 French right lower quadrant abscess drain.   Electronically Signed   By: Marybelle Killings M.D.   On: 05/18/2014 15:29    Medications / Allergies:  Scheduled Meds: . azelastine  1 spray Each Nare BID  . docusate sodium  100 mg Oral BID  . emtricitabine-tenofovir  1 tablet Oral Daily  . enoxaparin (LOVENOX) injection  30 mg Subcutaneous Q12H  . ertapenem  1 g Intravenous Q24H  . fluticasone  2 spray Each Nare BID  . fluticasone  2 puff Inhalation BID  . magnesium oxide  200 mg Oral Daily  . montelukast  10 mg Oral Daily  . pantoprazole  40 mg Oral Daily  . polyethylene glycol  17 g Oral Daily   Continuous Infusions: . 0.9 % NaCl with KCl 20 mEq / L 75 mL/hr at 05/19/14 0908   PRN Meds:.acetaminophen **OR** acetaminophen, albuterol, diphenhydrAMINE **OR** diphenhydrAMINE, morphine injection, ondansetron, oxyCODONE  Antibiotics: Anti-infectives    Start     Dose/Rate Route Frequency Ordered Stop   05/18/14 1200  emtricitabine-tenofovir (TRUVADA) 200-300 MG per tablet  1 tablet     1 tablet Oral Daily 05/18/14 0919     05/17/14 1800  ertapenem (INVANZ) 1 g in sodium chloride 0.9 % 50 mL IVPB     1 g 100 mL/hr over 30 Minutes Intravenous Every 24 hours 05/17/14 1626        Assessment/Plan POD#14 laparoscopic appendectomy for perforated appendix with  feculent peritonitis by Dr. Zella Richer Hepatic flexure and lateral abscess which appear to communicate Pelvic abscess S/p drain to RLQ 5/12.  Did not tolerate additional transgluteal drain, but I think its warranted.  Will check his labs if WBC, will ask IR to evaluate for additional drain placement. -mobilize -pain control--increase oxy IR, no toradol d/t interaction with antiretroviral's  ID-invanz D#2 for abscess, GNR on culture, follow Wound infection -pack BID with WIC to allow for drainage of pus HIV exposure-c/w home meds Asthma-home meds FEN-regular diet, pain control an issue, adjusted VTE prophylaxis- SCD/lovenix  Erby Pian, ANP-BC [facility] Pager [phone](7A-4:30P)   05/19/2014 11:08 AM

## 2014-05-19 NOTE — Progress Notes (Signed)
Patient ID: Jeff Hoffman, male   DOB: 03/14/1970, 44 y.o.   MRN: [id number]    Referring Physician(s): Rosenbower,Todd  Subjective:  Pt doing fairly well; does have some soreness at RLQ drain site; denies n/v  Allergies: Other and Erythromycin  Medications: Prior to Admission medications   Medication Sig Start Date End Date Taking? Authorizing Provider  acetaminophen (TYLENOL) 325 MG tablet Take 1-2 tablets (325-650 mg total) by mouth every 6 (six) hours as needed for fever, headache, mild pain or moderate pain. 05/10/14  Yes Emina Riebock, NP  acyclovir (ZOVIRAX) 400 MG tablet Take 400 mg by mouth 2 (two) times daily. PRN 01/20/12  Yes Ronnald NianJohn C Lalonde, MD  albuterol (PROVENTIL HFA;VENTOLIN HFA) 108 (90 BASE) MCG/ACT inhaler Inhale 2 puffs into the lungs every 6 (six) hours as needed. Patient taking differently: Inhale 2 puffs into the lungs every 6 (six) hours as needed for wheezing or shortness of breath.  09/10/10  Yes Ronnald NianJohn C Lalonde, MD  amoxicillin-clavulanate (AUGMENTIN) 875-125 MG per tablet Take 1 tablet by mouth 2 (two) times daily. 05/10/14  Yes Emina Riebock, NP  azelastine (ASTELIN) 0.1 % nasal spray USE 1 SPRAY IN EACH NOSTRIL TWICE A DAY. 12/19/13  Yes Ronnald NianJohn C Lalonde, MD  bismuth subsalicylate (PEPTO BISMOL) 262 MG/15ML suspension Take 30 mLs by mouth every 6 (six) hours as needed for indigestion.   Yes Historical Provider, MD  cetirizine (ZYRTEC) 10 MG tablet Take 1 tablet (10 mg total) by mouth daily. 02/28/13  Yes Ronnald NianJohn C Lalonde, MD  docusate sodium (COLACE) 100 MG capsule Take 100 mg by mouth 2 (two) times daily.   Yes Historical Provider, MD  fluticasone (FLONASE) 50 MCG/ACT nasal spray Place 2 sprays into both nostrils daily. Patient taking differently: Place 2 sprays into both nostrils 2 (two) times daily.  02/28/13  Yes Ronnald NianJohn C Lalonde, MD  HYDROcodone-acetaminophen (NORCO/VICODIN) 5-325 MG per tablet Take 1-2 tablets by mouth every 4 (four) hours as needed for moderate pain.  05/14/14  Yes Ronnald NianJohn C Lalonde, MD  hydrocortisone cream 0.5 % Apply 1 application topically daily as needed for itching.   Yes Historical Provider, MD  Magnesium 200 MG TABS Take 1 tablet by mouth daily.     Yes Historical Provider, MD  mineral oil enema Place 1 enema rectally once.   Yes Historical Provider, MD  montelukast (SINGULAIR) 10 MG tablet TAKE 1 TABLET ONCE DAILY. 04/20/14  Yes Ronnald NianJohn C Lalonde, MD  Naproxen Sodium (ALEVE PO) Take 2 tablets by mouth 2 (two) times daily.    Yes Historical Provider, MD  Omega-3 Fatty Acids (FISH OIL) 1000 MG CAPS Take 1 tablet by mouth daily.   Yes Historical Provider, MD  omeprazole (PRILOSEC) 40 MG capsule TAKE 1 CAPSULE DAILY. 03/09/14  Yes Ronnald NianJohn C Lalonde, MD  pseudoephedrine (SUDAFED) 60 MG tablet Take 60 mg by mouth every 4 (four) hours as needed for congestion.   Yes Historical Provider, MD  QVAR 80 MCG/ACT inhaler USE 1 PUFF TWICE A DAY. RINSE MOUTH. 03/09/14  Yes Ronnald NianJohn C Lalonde, MD  saccharomyces boulardii (FLORASTOR) 250 MG capsule Take 1 capsule (250 mg total) by mouth 2 (two) times daily. 05/10/14  Yes Emina Riebock, NP  simethicone (MYLICON) 80 MG chewable tablet Chew 80 mg by mouth every 6 (six) hours as needed for flatulence.   Yes Historical Provider, MD  TRUVADA 200-300 MG per tablet TAKE 1 TABLET DAILY. 03/09/14  Yes Ronnald NianJohn C Lalonde, MD     Vital Signs: BP  131/63 mmHg  Pulse 77  Temp(Src) 98 F (36.7 C) (Oral)  Resp 18  Ht 6\' 1"  (1.854 m)  Wt 201 lb (91.173 kg)  BMI 26.52 kg/m2  SpO2 95%  Physical Exam pt awake/alert; chest- sl dim BS rt base, left clear; heart- RRR; RLQ drain intact , insertion site ok, mild-mod tender, output 35 c's turbid, beige colored fluid; cx's pend; drain irrigated with 10 cc's sterile NS with return of same amt; some drainage also noted from previous open wound rt abd region  Imaging: Ct Abdomen Pelvis W Contrast  05/18/2014   CLINICAL DATA:  Patient status post laparoscopic appendectomy on 05/05/2014. Surgical  drain removed 1 week prior. Now with fever and pain.  EXAM: CT ABDOMEN AND PELVIS WITH CONTRAST  TECHNIQUE: Multidetector CT imaging of the abdomen and pelvis was performed using the standard protocol following bolus administration of intravenous contrast.  CONTRAST:  100mL OMNIPAQUE IOHEXOL 300 MG/ML  SOLN  COMPARISON:  CT 05/05/2014  FINDINGS: Lower chest:  Atelectasis in the right lung base.  No consolidation.  Hepatobiliary: No focal hepatic lesion. There is mild stranding within the gallbladder fossa. Tiny wall adherent calcification within the gallbladder measuring 1 mm image 40, series 3.  Pancreas: Pancreas is normal. No ductal dilatation. No pancreatic inflammation.  Spleen: Normal spleen  Adrenals/urinary tract: Adrenal glands and kidneys are normal. The ureters and bladder normal.  Stomach/Bowel: The stomach and small bowel are normal. Patient status post appendectomy. There is inflammation along the right pericolic gutter anterior to the ascending colon consistent recent surgery. There is a fluid collection extending beneath the hepatic flexure laterally and medially. There is a lateral pocket measuring 3.5 x 2.2 cm and a medial pocket measuring 5.1 by 3.4 cm. These 2 collections appear to connect medially beneath the hepatic flexure.  There is a second fluid collection in the posterior cul-de-sac measuring 6.6 x 3.7 cm.  There is no evidence of bowel obstruction or leak. Contrast transits to the descending colon.  Vascular/Lymphatic: Abdominal aorta is normal caliber. Aortocaval lymph node measures 8 mm on image 46, series 3. Ileocecal mesenteric lymph node measures 8 mm 13 mm.  Reproductive: Prostate is normal.  Musculoskeletal: No aggressive osseous lesion.  Other: Mild inflammation along the right upper quadrant laparoscopic port site.  IMPRESSION: 1. Post appendectomy peritoneal abscess. Elongated abscess beneath the hepatic flexure appears to communicate between a medial and lateral pocket. 2.  Second abscess in the the posterior cul-de-sac. 3. Mild inflammation in the gallbladder fossa the secondary to abscess described above. 4. Mild inflammation along the right upper quadrant laparoscopic port site. 5. No evidence of bowel obstruction or leak of oral contrast. 6. Mild mesenteric retroperitoneal adenopathy consistent with abdominal infection. Findings conveyed to Irmina  Rebac PA on 05/17/2014  at16:16.   Electronically Signed   By: Genevive BiStewart  Edmunds M.D.   On: 05/17/2014 16:18   Ct Image Guided Drainage Percut Cath  Peritoneal Retroperit  05/18/2014   CLINICAL DATA:  Appendicitis.  Abscess.  EXAM: CT-GUIDED ABSCESS DRAINAGE  FLUOROSCOPY TIME:  None  MEDICATIONS AND MEDICAL HISTORY: Versed 5 mg, Fentanyl 100 mcg.  Additional Medications: None.  ANESTHESIA/SEDATION: Moderate sedation time: 19 minutes  CONTRAST:  None  PROCEDURE: The procedure, risks, benefits, and alternatives were explained to the patient. Questions regarding the procedure were encouraged and answered. The patient understands and consents to the procedure.  The right lower quadrant was prepped with Betadine in a sterile fashion, and a sterile drape was applied  covering the operative field. A sterile gown and sterile gloves were used for the procedure.  Under CT guidance, an 18 gauge needle was inserted into the right lower quadrant abscess. It was removed over an Amplatz wire. A 12 French dilator followed by a 12 [place] drain were inserted. It was looped and string fixed. Pus was aspirated. It was sewn to the skin.  FINDINGS: Images document 57 French drain placement into a right lower quadrant abscess.  COMPLICATIONS: None  IMPRESSION: Successful 12 French right lower quadrant abscess drain.   Electronically Signed   By: Jolaine Click M.D.   On: 05/18/2014 15:29    Labs:  CBC:  Recent Labs  05/07/14 0540 05/10/14 0430 05/17/14 1711 05/18/14 0454  WBC 7.4 4.8 10.7* 7.9  HGB 11.4* 9.9* 9.7* 8.7*  HCT 36.0* 30.2* 29.8* 27.1*   PLT 133* 186 439* 426*    COAGS:  Recent Labs  05/18/14 1300  INR 1.23    BMP:  Recent Labs  05/04/14 2241 05/17/14 1711 05/18/14 0454  NA 137 132* 132*  K 3.7 4.2 4.4  CL 102 94* 94*  CO2 GLUCOSE 123* 101* 106*  BUN 24* 21* 18  CALCIUM 9.1 8.5* 8.3*  CREATININE 0.84 0.87 0.96  GFRNONAA >90 >60 >60  GFRAA >90 >60 >60    LIVER FUNCTION TESTS:  Recent Labs  05/04/14 2241  BILITOT 0.8  AST 17  ALT 16  ALKPHOS 75  PROT 7.1  ALBUMIN 4.5    Assessment and Plan: Pt s/p lap appy with pelvic abscesses; RLQ drain placed 5/12; check final cx's; cont drain irrigation, monitor labs; asked today by CCS to drain remaining cul de sac abscess; details/risks of procedure d/w pt/mother with their understanding and consent; will plan to perform later today.   Signed: D. Jeananne Rama 05/19/2014, 9:56 AM   I spent a total of 15 minutes in face to face in clinical consultation/evaluation, greater than 50% of which was counseling/coordinating care for RLQ abscess drain

## 2014-05-19 NOTE — Clinical Documentation Improvement (Signed)
Please document in note/discharge summary  condition for below lab. Thank you.  Abnormal Lab and/or Testing Results: Component     Latest Ref Rng 05/17/2014 05/18/2014  Sodium     135 - 145 mmol/L 132 (L) 132 (L)  Chloride     101 - 111 mmol/L 94 (L) 94 (L)   Treatment provided: Continuous Infusions: . 0.9 % NaCl with KCl 20 mEq / L 75 mL/hr at 05/19/14 [id number]         Possible Clinical Conditions: ___________Hyponatremia ___________Dehydration ___________Other clinical condition ___________Unable to determine   Thank you, Elpidio AnisGarnet Alver Leete, RN, BSN, CDI [phone] [facility]

## 2014-05-19 NOTE — Procedures (Signed)
Technically successful CT guided drainage catheter placement into pelvis via right TG approach yielding 25 cc of purulent material.  Samples sent to laboratory.  No immediate post procedural complications.

## 2014-05-20 LAB — BASIC METABOLIC PANEL
ANION GAP: 10 (ref 5–15)
BUN: 14 mg/dL (ref 6–20)
CALCIUM: 8.5 mg/dL — AB (ref 8.9–10.3)
CO2: 30 mmol/L (ref 22–32)
Chloride: 96 mmol/L — ABNORMAL LOW (ref 101–111)
Creatinine, Ser: 0.91 mg/dL (ref 0.61–1.24)
GFR calc Af Amer: >60 mL/min (ref >60)
GFR calc non Af Amer: >60 mL/min (ref >60)
Glucose, Bld: 101 mg/dL — ABNORMAL HIGH (ref 65–99)
Potassium: 3.9 mmol/L (ref 3.5–5.1)
Sodium: 136 mmol/L (ref 135–145)

## 2014-05-20 LAB — CBC
HEMATOCRIT: 30.4 % — AB (ref 39.0–52.0)
Hemoglobin: 9.7 g/dL — ABNORMAL LOW (ref 13.0–17.0)
MCH: 27.8 pg (ref 26.0–34.0)
MCHC: 31.9 g/dL (ref 30.0–36.0)
MCV: 87.1 fL (ref 78.0–100.0)
PLATELETS: 477 10*3/uL — AB (ref 150–400)
RBC: 3.49 MIL/uL — AB (ref 4.22–5.81)
RDW: 13.9 % (ref 11.5–15.5)
WBC: 4.3 10*3/uL (ref 4.0–10.5)

## 2014-05-20 NOTE — Progress Notes (Signed)
Subjective: Feels wiped out.  Ate all of his breakfast.  Objective: Vital signs in last 24 hours: Temp:  [97.7 F (36.5 C)-98.4 F (36.9 C)] 97.7 F (36.5 C) (05/14 0527) Pulse Rate:  [59-90] 59 (05/14 0527) Resp:  [12-23] 16 (05/14 0527) BP: (94-141)/(53-83) 119/65 mmHg (05/14 0527) SpO2:  [97 %-100 %] 100 % (05/14 0527) Last BM Date: 05/17/14  Intake/Output from previous day: 05/13 0701 - 05/14 0700 In: 1050 [I.V.:900; IV Piggyback:150] Out: 1975 [Urine:1925; Drains:50] Intake/Output this shift:    PE: General- In NAD Abdomen-soft; open RUQ wound with thin cloudy drainage, erythema and induration are improved; purulent RUQ drain output Gluteal drain with purulent output  Lab Results:   Recent Labs  05/19/14 1130 05/20/14 0535  WBC 7.2 4.3  HGB 10.3* 9.7*  HCT 31.6* 30.4*  PLT 526* 477*   BMET  Recent Labs  05/18/14 0454 05/20/14 0535  NA 132* 136  K 4.4 3.9  CL 94* 96*  CO2 28 30  GLUCOSE 106* 101*  BUN 18 14  CREATININE 0.96 0.91  CALCIUM 8.3* 8.5*   PT/INR  Recent Labs  05/18/14 1300  LABPROT 15.6*  INR 1.23   Comprehensive Metabolic Panel:    Component Value Date/Time   NA 136 05/20/2014 0535   NA 132* 05/18/2014 0454   K 3.9 05/20/2014 0535   K 4.4 05/18/2014 0454   CL 96* 05/20/2014 0535   CL 94* 05/18/2014 0454   CO2 30 05/20/2014 0535   CO2 28 05/18/2014 0454   BUN 14 05/20/2014 0535   BUN 18 05/18/2014 0454   CREATININE 0.91 05/20/2014 0535   CREATININE 0.96 05/18/2014 0454   CREATININE 0.72 03/17/2013 1153   CREATININE 0.85 02/28/2013 1502   GLUCOSE 101* 05/20/2014 0535   GLUCOSE 106* 05/18/2014 0454   CALCIUM 8.5* 05/20/2014 0535   CALCIUM 8.3* 05/18/2014 0454   AST 17 05/04/2014 2241   AST 20 03/17/2013 1153   ALT 16 05/04/2014 2241   ALT 24 03/17/2013 1153   ALKPHOS 75 05/04/2014 2241   ALKPHOS 103 03/17/2013 1153   BILITOT 0.8 05/04/2014 2241   BILITOT 0.5 03/17/2013 1153   PROT 7.1 05/04/2014 2241   PROT  6.5 03/17/2013 1153   ALBUMIN 4.5 05/04/2014 2241   ALBUMIN 4.3 03/17/2013 1153     Studies/Results: Ct Image Guided Drainage Percut Cath  Peritoneal Retroperit  05/19/2014   INDICATION: Post appendectomy, complicated by postoperative abscess.  Post CT-guided percutaneous drainage catheter placement within the right lower abdominal quadrant (05/18/2014) but with persistent indeterminate fluid collection within the pelvic cul-de-sac. Please perform CT-guided percutaneous drainage catheter placement.  EXAM: CT-GUIDED RIGHT TRANS GLUTEAL APPROACH PELVIC ABSCESS DRAINAGE CATHETER PLACEMENT  COMPARISON:  CT abdomen pelvis - 05/05/2014; 05/17/2014; CT-guided right lower quadrant percutaneous abscess drainage catheter placement - 05/18/2014  MEDICATIONS: The patient is currently admitted to the hospital and receiving intravenous antibiotics. The antibiotics were administered within an appropriate time frame prior to the initiation of the procedure.  ANESTHESIA/SEDATION: Fentanyl 100 mcg IV; Versed 2.5 mg IV  Total Moderate Sedation time  16 minutes  CONTRAST:  None  COMPLICATIONS: None immediate  PROCEDURE: Informed written consent was obtained from the patient after a discussion of the risks, benefits and alternatives to treatment. The patient was placed prone, slightly RPO on the CT gantry and a pre procedural CT was performed re-demonstrating the known abscess/fluid collection within the lower pelvis, measuring at least 6.4 x 3.4 cm (image 1, series 2). The procedure was  planned. A timeout was performed prior to the initiation of the procedure.  The right buttocks was prepped and draped in the usual sterile fashion. The overlying soft tissues were anesthetized with 1% lidocaine with epinephrine. Appropriate trajectory was planned with the use of a 22 gauge spinal needle. An 18 gauge trocar needle was advanced into the abscess/fluid collection and a short Amplatz super stiff wire was coiled within the collection.  Appropriate positioning was confirmed with a limited CT scan. The tract was serially dilated allowing placement of a 10 [place] all-purpose drainage catheter. Appropriate positioning was confirmed with a limited postprocedural CT scan.  Approximately 25 cc of purulent fluid was aspirated. The tube was connected to a drainage bag and sutured in place. A dressing was placed. The patient tolerated the procedure well without immediate post procedural complication.  IMPRESSION: Successful CT guided placement of a 10 French all purpose drain catheter into the pelvis via right trans gluteal approach with aspiration of 25 mL of purulent fluid. Samples were sent to the laboratory as requested by the ordering clinical team.   Electronically Signed   By: Simonne ComeJohn  Watts M.D.   On: 05/19/2014 15:23   Ct Image Guided Drainage Percut Cath  Peritoneal Retroperit  05/18/2014   CLINICAL DATA:  Appendicitis.  Abscess.  EXAM: CT-GUIDED ABSCESS DRAINAGE  FLUOROSCOPY TIME:  None  MEDICATIONS AND MEDICAL HISTORY: Versed 5 mg, Fentanyl 100 mcg.  Additional Medications: None.  ANESTHESIA/SEDATION: Moderate sedation time: 19 minutes  CONTRAST:  None  PROCEDURE: The procedure, risks, benefits, and alternatives were explained to the patient. Questions regarding the procedure were encouraged and answered. The patient understands and consents to the procedure.  The right lower quadrant was prepped with Betadine in a sterile fashion, and a sterile drape was applied covering the operative field. A sterile gown and sterile gloves were used for the procedure.  Under CT guidance, an 18 gauge needle was inserted into the right lower quadrant abscess. It was removed over an Amplatz wire. A 12 French dilator followed by a 12 [place] drain were inserted. It was looped and string fixed. Pus was aspirated. It was sewn to the skin.  FINDINGS: Images document 7112 French drain placement into a right lower quadrant abscess.  COMPLICATIONS: None  IMPRESSION:  Successful 12 French right lower quadrant abscess drain.   Electronically Signed   By: Jolaine ClickArthur  Hoss M.D.   On: 05/18/2014 15:29    Anti-infectives: Anti-infectives    Start     Dose/Rate Route Frequency Ordered Stop   05/18/14 1200  emtricitabine-tenofovir (TRUVADA) 200-300 MG per tablet 1 tablet     1 tablet Oral Daily 05/18/14 0919     05/17/14 1800  ertapenem (INVANZ) 1 g in sodium chloride 0.9 % 50 mL IVPB     1 g 100 mL/hr over 30 Minutes Intravenous Every 24 hours 05/17/14 1626        Assessment Active Problems:  Postop Intra-abdominal abscess following appendectomy with perforated appendicitis 05/05/14 (Harlie Buening)-s/p placement of percutaneous drains with fluid growing GPCs and GNRs   LOS: 3 days   Plan: Ambulate.  Continue drains and broad spectrum abx.  Wait for culture and sensitivity results.   Paitynn Mikus J 05/20/2014

## 2014-05-21 LAB — BODY FLUID CULTURE

## 2014-05-21 NOTE — Plan of Care (Signed)
Problem: Phase II Progression Outcomes Goal: Other Phase II Outcomes/Goals Outcome: Completed/Met Date Met:  05/21/14 Pt eating a regular diet

## 2014-05-21 NOTE — Progress Notes (Signed)
Subjective: Tolerating po Sore at drain sites Passing flatus No BM in several days  Objective: Vital signs in last 24 hours: Temp:  [98.2 F (36.8 C)-98.6 F (37 C)] 98.3 F (36.8 C) (05/15 0542) Pulse Rate:  [71-77] 74 (05/15 0542) Resp:  [16] 16 (05/15 0542) BP: (121-122)/(64) 122/64 mmHg (05/15 0542) SpO2:  [91 %-99 %] 96 % (05/15 0928) Last BM Date: 05/17/14  Intake/Output from previous day: 05/14 0701 - 05/15 0700 In: 2740 [I.V.:2700] Out: 1830 [Urine:1750; Drains:80] Intake/Output this shift: Total I/O In: 300 [I.V.:300] Out: -   Abdomen soft, minimally tender Drains purulent  Lab Results:   Recent Labs  05/19/14 1130 05/20/14 0535  WBC 7.2 4.3  HGB 10.3* 9.7*  HCT 31.6* 30.4*  PLT 526* 477*   BMET  Recent Labs  05/20/14 0535  NA 136  K 3.9  CL 96*  CO2 30  GLUCOSE 101*  BUN 14  CREATININE 0.91  CALCIUM 8.5*   PT/INR  Recent Labs  05/18/14 1300  LABPROT 15.6*  INR 1.23   ABG No results for input(s): PHART, HCO3 in the last 72 hours.  Invalid input(s): PCO2, PO2  Studies/Results: Ct Image Guided Drainage Percut Cath  Peritoneal Retroperit  05/19/2014   INDICATION: Post appendectomy, complicated by postoperative abscess.  Post CT-guided percutaneous drainage catheter placement within the right lower abdominal quadrant (05/18/2014) but with persistent indeterminate fluid collection within the pelvic cul-de-sac. Please perform CT-guided percutaneous drainage catheter placement.  EXAM: CT-GUIDED RIGHT TRANS GLUTEAL APPROACH PELVIC ABSCESS DRAINAGE CATHETER PLACEMENT  COMPARISON:  CT abdomen pelvis - 05/05/2014; 05/17/2014; CT-guided right lower quadrant percutaneous abscess drainage catheter placement - 05/18/2014  MEDICATIONS: The patient is currently admitted to the hospital and receiving intravenous antibiotics. The antibiotics were administered within an appropriate time frame prior to the initiation of the procedure.   ANESTHESIA/SEDATION: Fentanyl 100 mcg IV; Versed 2.5 mg IV  Total Moderate Sedation time  16 minutes  CONTRAST:  None  COMPLICATIONS: None immediate  PROCEDURE: Informed written consent was obtained from the patient after a discussion of the risks, benefits and alternatives to treatment. The patient was placed prone, slightly RPO on the CT gantry and a pre procedural CT was performed re-demonstrating the known abscess/fluid collection within the lower pelvis, measuring at least 6.4 x 3.4 cm (image 1, series 2). The procedure was planned. A timeout was performed prior to the initiation of the procedure.  The right buttocks was prepped and draped in the usual sterile fashion. The overlying soft tissues were anesthetized with 1% lidocaine with epinephrine. Appropriate trajectory was planned with the use of a 22 gauge spinal needle. An 18 gauge trocar needle was advanced into the abscess/fluid collection and a short Amplatz super stiff wire was coiled within the collection. Appropriate positioning was confirmed with a limited CT scan. The tract was serially dilated allowing placement of a 10 [place] all-purpose drainage catheter. Appropriate positioning was confirmed with a limited postprocedural CT scan.  Approximately 25 cc of purulent fluid was aspirated. The tube was connected to a drainage bag and sutured in place. A dressing was placed. The patient tolerated the procedure well without immediate post procedural complication.  IMPRESSION: Successful CT guided placement of a 10 French all purpose drain catheter into the pelvis via right trans gluteal approach with aspiration of 25 mL of purulent fluid. Samples were sent to the laboratory as requested by the ordering clinical team.   Electronically Signed   By: Holland CommonsJohn  Watts M.D.  On: 05/19/2014 15:23    Anti-infectives: Anti-infectives    Start     Dose/Rate Route Frequency Ordered Stop   05/18/14 1200  emtricitabine-tenofovir (TRUVADA) 200-300 MG per tablet 1  tablet     1 tablet Oral Daily 05/18/14 0919     05/17/14 1800  ertapenem (INVANZ) 1 g in sodium chloride 0.9 % 50 mL IVPB     1 g 100 mL/hr over 30 Minutes Intravenous Every 24 hours 05/17/14 1626        Assessment/Plan: s/p * No surgery found *  Continuing iv antibiotics Repeat CT soon.  LOS: 4 days    Noal Abshier A 05/21/2014

## 2014-05-22 DIAGNOSIS — K651 Peritoneal abscess: Secondary | ICD-10-CM | POA: Insufficient documentation

## 2014-05-22 LAB — CULTURE, ROUTINE-ABSCESS: SPECIAL REQUESTS: NORMAL

## 2014-05-22 MED ORDER — BISACODYL 10 MG RE SUPP
10.0000 mg | Freq: Once | RECTAL | Status: DC
  Filled 2014-05-22: qty 1

## 2014-05-22 NOTE — Progress Notes (Signed)
Patient ID: Jeff Hoffman, male   DOB: [birth year], 44 y.o.   MRN: [id number]    Referring Physician(s): Rosenbower,Todd  Subjective:  Patient doing fairly well; continues to have some soreness at drain sites; passing gas  Allergies: Other and Erythromycin  Medications: Prior to Admission medications   Medication Sig Start Date End Date Taking? Authorizing Provider  acetaminophen (TYLENOL) 325 MG tablet Take 1-2 tablets (325-650 mg total) by mouth every 6 (six) hours as needed for fever, headache, mild pain or moderate pain. 05/10/14  Yes Emina Riebock, NP  acyclovir (ZOVIRAX) 400 MG tablet Take 400 mg by mouth 2 (two) times daily. PRN 01/20/12  Yes Ronnald Nian, MD  albuterol (PROVENTIL HFA;VENTOLIN HFA) 108 (90 BASE) MCG/ACT inhaler Inhale 2 puffs into the lungs every 6 (six) hours as needed. Patient taking differently: Inhale 2 puffs into the lungs every 6 (six) hours as needed for wheezing or shortness of breath.  09/10/10  Yes Ronnald Nian, MD  amoxicillin-clavulanate (AUGMENTIN) 875-125 MG per tablet Take 1 tablet by mouth 2 (two) times daily. 05/10/14  Yes Emina Riebock, NP  azelastine (ASTELIN) 0.1 % nasal spray USE 1 SPRAY IN EACH NOSTRIL TWICE A DAY. 12/19/13  Yes Ronnald Nian, MD  bismuth subsalicylate (PEPTO BISMOL) 262 MG/15ML suspension Take 30 mLs by mouth every 6 (six) hours as needed for indigestion.   Yes Historical Provider, MD  cetirizine (ZYRTEC) 10 MG tablet Take 1 tablet (10 mg total) by mouth daily. 02/28/13  Yes Ronnald Nian, MD  docusate sodium (COLACE) 100 MG capsule Take 100 mg by mouth 2 (two) times daily.   Yes Historical Provider, MD  fluticasone (FLONASE) 50 MCG/ACT nasal spray Place 2 sprays into both nostrils daily. Patient taking differently: Place 2 sprays into both nostrils 2 (two) times daily.  02/28/13  Yes Ronnald Nian, MD  HYDROcodone-acetaminophen (NORCO/VICODIN) 5-325 MG per tablet Take 1-2 tablets by mouth every 4 (four) hours as needed for  moderate pain. 05/14/14  Yes Ronnald Nian, MD  hydrocortisone cream 0.5 % Apply 1 application topically daily as needed for itching.   Yes Historical Provider, MD  Magnesium 200 MG TABS Take 1 tablet by mouth daily.     Yes Historical Provider, MD  mineral oil enema Place 1 enema rectally once.   Yes Historical Provider, MD  montelukast (SINGULAIR) 10 MG tablet TAKE 1 TABLET ONCE DAILY. 04/20/14  Yes Ronnald Nian, MD  Naproxen Sodium (ALEVE PO) Take 2 tablets by mouth 2 (two) times daily.    Yes Historical Provider, MD  Omega-3 Fatty Acids (FISH OIL) 1000 MG CAPS Take 1 tablet by mouth daily.   Yes Historical Provider, MD  omeprazole (PRILOSEC) 40 MG capsule TAKE 1 CAPSULE DAILY. 03/09/14  Yes Ronnald Nian, MD  pseudoephedrine (SUDAFED) 60 MG tablet Take 60 mg by mouth every 4 (four) hours as needed for congestion.   Yes Historical Provider, MD  QVAR 80 MCG/ACT inhaler USE 1 PUFF TWICE A DAY. RINSE MOUTH. 03/09/14  Yes Ronnald Nian, MD  saccharomyces boulardii (FLORASTOR) 250 MG capsule Take 1 capsule (250 mg total) by mouth 2 (two) times daily. 05/10/14  Yes Emina Riebock, NP  simethicone (MYLICON) 80 MG chewable tablet Chew 80 mg by mouth every 6 (six) hours as needed for flatulence.   Yes Historical Provider, MD  TRUVADA 200-300 MG per tablet TAKE 1 TABLET DAILY. 03/09/14  Yes Ronnald Nian, MD     Vital Signs: BP  115/58 mmHg  Pulse 60  Temp(Src) 97.7 F (36.5 C) (Oral)  Resp 18  Ht 6\' 1"  (1.854 m)  Wt 201 lb (91.173 kg)  BMI 26.52 kg/m2  SpO2 96%  Physical Exam pt awake/alert; RLQ/TG drains intact, outputs 15/20 cc's respectively; cx's - E coli; sites mild-mod tender to palpation  Imaging: Ct Image Guided Drainage Percut Cath  Peritoneal Retroperit  05/19/2014   INDICATION: Post appendectomy, complicated by postoperative abscess.  Post CT-guided percutaneous drainage catheter placement within the right lower abdominal quadrant (05/18/2014) but with persistent indeterminate fluid  collection within the pelvic cul-de-sac. Please perform CT-guided percutaneous drainage catheter placement.  EXAM: CT-GUIDED RIGHT TRANS GLUTEAL APPROACH PELVIC ABSCESS DRAINAGE CATHETER PLACEMENT  COMPARISON:  CT abdomen pelvis - 05/05/2014; 05/17/2014; CT-guided right lower quadrant percutaneous abscess drainage catheter placement - 05/18/2014  MEDICATIONS: The patient is currently admitted to the hospital and receiving intravenous antibiotics. The antibiotics were administered within an appropriate time frame prior to the initiation of the procedure.  ANESTHESIA/SEDATION: Fentanyl 100 mcg IV; Versed 2.5 mg IV  Total Moderate Sedation time  16 minutes  CONTRAST:  None  COMPLICATIONS: None immediate  PROCEDURE: Informed written consent was obtained from the patient after a discussion of the risks, benefits and alternatives to treatment. The patient was placed prone, slightly RPO on the CT gantry and a pre procedural CT was performed re-demonstrating the known abscess/fluid collection within the lower pelvis, measuring at least 6.4 x 3.4 cm (image 1, series 2). The procedure was planned. A timeout was performed prior to the initiation of the procedure.  The right buttocks was prepped and draped in the usual sterile fashion. The overlying soft tissues were anesthetized with 1% lidocaine with epinephrine. Appropriate trajectory was planned with the use of a 22 gauge spinal needle. An 18 gauge trocar needle was advanced into the abscess/fluid collection and a short Amplatz super stiff wire was coiled within the collection. Appropriate positioning was confirmed with a limited CT scan. The tract was serially dilated allowing placement of a 10 [place] all-purpose drainage catheter. Appropriate positioning was confirmed with a limited postprocedural CT scan.  Approximately 25 cc of purulent fluid was aspirated. The tube was connected to a drainage bag and sutured in place. A dressing was placed. The patient tolerated the  procedure well without immediate post procedural complication.  IMPRESSION: Successful CT guided placement of a 10 French all purpose drain catheter into the pelvis via right trans gluteal approach with aspiration of 25 mL of purulent fluid. Samples were sent to the laboratory as requested by the ordering clinical team.   Electronically Signed   By: Simonne ComeJohn  Watts M.D.   On: 05/19/2014 15:23   Ct Image Guided Drainage Percut Cath  Peritoneal Retroperit  05/18/2014   CLINICAL DATA:  Appendicitis.  Abscess.  EXAM: CT-GUIDED ABSCESS DRAINAGE  FLUOROSCOPY TIME:  None  MEDICATIONS AND MEDICAL HISTORY: Versed 5 mg, Fentanyl 100 mcg.  Additional Medications: None.  ANESTHESIA/SEDATION: Moderate sedation time: 19 minutes  CONTRAST:  None  PROCEDURE: The procedure, risks, benefits, and alternatives were explained to the patient. Questions regarding the procedure were encouraged and answered. The patient understands and consents to the procedure.  The right lower quadrant was prepped with Betadine in a sterile fashion, and a sterile drape was applied covering the operative field. A sterile gown and sterile gloves were used for the procedure.  Under CT guidance, an 18 gauge needle was inserted into the right lower quadrant abscess. It was removed over  an Amplatz wire. A 12 French dilator followed by a 12 [place] drain were inserted. It was looped and string fixed. Pus was aspirated. It was sewn to the skin.  FINDINGS: Images document 3912 French drain placement into a right lower quadrant abscess.  COMPLICATIONS: None  IMPRESSION: Successful 12 French right lower quadrant abscess drain.   Electronically Signed   By: Jolaine ClickArthur  Hoss M.D.   On: 05/18/2014 15:29    Labs:  CBC:  Recent Labs  05/17/14 1711 05/18/14 0454 05/19/14 1130 05/20/14 0535  WBC 10.7* 7.9 7.2 4.3  HGB 9.7* 8.7* 10.3* 9.7*  HCT 29.8* 27.1* 31.6* 30.4*  PLT 439* 426* 526* 477*    COAGS:  Recent Labs  05/18/14 1300  INR 1.23     BMP:  Recent Labs  05/04/14 2241 05/17/14 1711 05/18/14 0454 05/20/14 0535  NA 137 132* 132* 136  K 3.7 4.2 4.4 3.9  CL 102 94* 94* 96*  CO2 25 27 28 30   GLUCOSE 123* 101* 106* 101*  BUN 24* 21* 18 14  CALCIUM 9.1 8.5* 8.3* 8.5*  CREATININE 0.84 0.87 0.96 0.91  GFRNONAA >90 >60 >60 >60  GFRAA >90 >60 >60 >60    LIVER FUNCTION TESTS:  Recent Labs  05/04/14 2241  BILITOT 0.8  AST 17  ALT 16  ALKPHOS 75  PROT 7.1  ALBUMIN 4.5    Assessment and Plan: Pt s/p lap appy with pelvic abscesses; RLQ drain placed 5/12; right TG drain placed 5/13; cx's- e coli; WBC nl; afebrile; cont current tx; check CT in 2 days per [facility]  Signed: D. Jeananne RamaKevin Dominik Lauricella 05/22/2014, 1:38 PM   I spent a total of 15 minutes  in clinical consultation/evaluation, greater than 50% of which was counseling/coordinating care for pelvic abscess drains

## 2014-05-22 NOTE — Progress Notes (Signed)
Patient ID: Jeff Hoffman, male   DOB: 10/10/1970, 44 y.o.   MRN: [id number]     [facility]      [address]   [place], [place] [id number]    Phone: [phone] FAX: [phone]     Subjective: Appetite has been good until last night.  No n/v.  Passing flatus.  Last BM 5/11.  Walking.  Purulent drain output.  Pain with flush to TG drain.   Rt abd--4615ml purulent transgluteal 20ml   Objective:  Vital signs:  Filed Vitals:   05/21/14 0928 05/21/14 1400 05/21/14 2153 05/22/14 0400  BP:  121/62 121/62 115/58  Pulse:  70 70 60  Temp:  97.8 F (36.6 C) 98.1 F (36.7 C) 97.7 F (36.5 C)  TempSrc:  Oral Oral Oral  Resp:  16 16 18   Height:      Weight:      SpO2: 96% 97% 99% 97%    Last BM Date: 05/17/14  Intake/Output   Yesterday:  05/15 0701 - 05/16 0700 In: 1848.8 [I.V.:1803.8] Out: 335 [Urine:300; Drains:35] This shift:  Total I/O In: 120 [P.O.:120] Out: 250 [Urine:250]   Physical Exam: General: Pt awake/alert/oriented x4 in no acute distress Chest: cta bilaterally. No chest wall pain w good excursion CV: Pulses intact. Regular rhythm MS: Normal AROM mjr joints. No obvious deformity Abdomen: Soft. distended. Tender. Right abd incision dressing is c/d/i.  transgluteal drain with purulent output. RLQ drain with purulent output. No evidence of peritonitis. No incarcerated hernias. Ext: SCDs BLE. No mjr edema. No cyanosis Skin: No petechiae / purpura  Problem List:   Active Problems:   Intra-abdominal abscess    Results:   Labs: No results found for this or any previous visit (from the past 48 hour(s)).  Imaging / Studies: No results found.  Medications / Allergies:  Scheduled Meds: . azelastine  1 spray Each Nare BID  . docusate sodium  100 mg Oral BID  . emtricitabine-tenofovir  1 tablet Oral Daily  . enoxaparin (LOVENOX) injection  30 mg Subcutaneous Q12H  . ertapenem  1 g Intravenous  Q24H  . fluticasone  2 spray Each Nare BID  . fluticasone  2 puff Inhalation BID  . magnesium oxide  200 mg Oral Daily  . montelukast  10 mg Oral Daily  . pantoprazole  40 mg Oral Daily  . polyethylene glycol  17 g Oral Daily   Continuous Infusions: . 0.9 % NaCl with KCl 20 mEq / L 75 mL/hr at 05/22/14 0610   PRN Meds:.acetaminophen **OR** acetaminophen, albuterol, diphenhydrAMINE **OR** diphenhydrAMINE, morphine injection, ondansetron, oxyCODONE  Antibiotics: Anti-infectives    Start     Dose/Rate Route Frequency Ordered Stop   05/18/14 1200  emtricitabine-tenofovir (TRUVADA) 200-300 MG per tablet 1 tablet     1 tablet Oral Daily 05/18/14 0919     05/17/14 1800  ertapenem (INVANZ) 1 g in sodium chloride 0.9 % 50 mL IVPB     1 g 100 mL/hr over 30 Minutes Intravenous Every 24 hours 05/17/14 1626         Assessment/Plan POD#17 laparoscopic appendectomy for perforated appendix with feculent peritonitis by Dr. Abbey Chattersosenbower  Readmitted 5/11--- Hepatic flexure and lateral abscess which appear to communicate Pelvic abscess -S/p drain to RLQ 5/12 and pelvic drain 5/13 -needs repeat CT scan, will discuss timing with attending ID-invanz D#5 for abscess, e coli on CX Wound infection -pack BID with [facility] HIV exposure-c/w home meds Asthma-home meds FEN-regular  diet, pain control still an issue, toradol contraindicated.  C/w oxyIR, morphine for breakthrough VTE prophylaxis- SCD/lovenox Dispo-repeat CT soon   Ashok NorrisEmina Mulki Roesler, [facility] ServicesNP-BC Central  Surgery Pager [phone](7A-4:30P)   05/22/2014 8:43 AM

## 2014-05-23 LAB — ANAEROBIC CULTURE

## 2014-05-23 NOTE — Progress Notes (Signed)
Patient ID: Jeff Hoffman, male   DOB: 12/25/1970, 44 y.o.   MRN: [id number]     [facility]      [address]   [place], [place] [id number]    Phone: [phone] FAX: [phone]     Subjective: Pain at rectum and with drain flushes.  Flatus.  No BM.  Did not have dulcolax yesterday.  Tolerating POs. VSS.  Afebrile.   Objective:  Vital signs:  Filed Vitals:   05/22/14 0858 05/22/14 1400 05/22/14 2138 05/23/14 0519  BP:  117/57 125/68 116/71  Pulse:  68 71 64  Temp:  98 F (36.7 C) 98.2 F (36.8 C) 97.8 F (36.6 C)  TempSrc:  Oral Oral Oral  Resp:  18 16 16   Height:      Weight:      SpO2: 96% 100% 100% 100%    Last BM Date: 05/17/14  Intake/Output   Yesterday:  05/16 0701 - 05/17 0700 In: 1901.3 [P.O.:360; I.V.:1496.3] Out: 1515 [Urine:1475; Drains:40] This shift:    I/O last 3 completed shifts: In: 2835 [P.O.:360; I.V.:2400; Other:75] Out: 1835 [Urine:1775; Drains:60]    Physical Exam: General: Pt awake/alert/oriented x4 in no acute distress Chest: cta bilaterally. No chest wall pain w good excursion CV: Pulses intact. Regular rhythm MS: Normal AROM mjr joints. No obvious deformity Abdomen: Soft. distended. Tender. right lap site-wound is open, purulent drainage is minimal packing replaced, no erythema.  transgluteal drain with serosanguinous output.Marland Kitchen. RLQ drain with serous output.   No evidence of peritonitis. No incarcerated hernias. Ext: SCDs BLE. No mjr edema. No cyanosis Skin: No petechiae / purpura   Problem List:   Active Problems:   Intra-abdominal abscess   Abscess of male pelvis    Results:   Labs: No results found for this or any previous visit (from the past 48 hour(s)).  Imaging / Studies: No results found.  Medications / Allergies:  Scheduled Meds: . azelastine  1 spray Each Nare BID  . bisacodyl  10 mg Rectal Once  . docusate sodium  100 mg Oral BID  .  emtricitabine-tenofovir  1 tablet Oral Daily  . enoxaparin (LOVENOX) injection  30 mg Subcutaneous Q12H  . ertapenem  1 g Intravenous Q24H  . fluticasone  2 spray Each Nare BID  . fluticasone  2 puff Inhalation BID  . magnesium oxide  200 mg Oral Daily  . montelukast  10 mg Oral Daily  . pantoprazole  40 mg Oral Daily  . polyethylene glycol  17 g Oral Daily   Continuous Infusions: . 0.9 % NaCl with KCl 20 mEq / L 75 mL/hr at 05/23/14 0514   PRN Meds:.acetaminophen **OR** acetaminophen, albuterol, diphenhydrAMINE **OR** diphenhydrAMINE, morphine injection, ondansetron, oxyCODONE  Antibiotics: Anti-infectives    Start     Dose/Rate Route Frequency Ordered Stop   05/18/14 1200  emtricitabine-tenofovir (TRUVADA) 200-300 MG per tablet 1 tablet     1 tablet Oral Daily 05/18/14 0919     05/17/14 1800  ertapenem (INVANZ) 1 g in sodium chloride 0.9 % 50 mL IVPB     1 g 100 mL/hr over 30 Minutes Intravenous Every 24 hours 05/17/14 1626         Assessment/Plan POD#17 laparoscopic appendectomy for perforated appendix with feculent peritonitis by Dr. Abbey Chattersosenbower  Readmitted 5/11--- Hepatic flexure and lateral abscess which appear to communicate Pelvic abscess -S/p drain to RLQ 5/12 and pelvic drain 5/13 -repeat CT in AM ID-invanz D#6 for abscess, e  coli on CX Wound infection -pack BID with [facility] HIV exposure-c/w home meds Asthma-home meds FEN-regular diet, pain control still an issue, toradol contraindicated. C/w oxyIR, morphine for breakthrough VTE prophylaxis- SCD/lovenox Dispo-CT in AM.  Mother is not sure she can do drain care.  Will need HH and will start teaching now to familiarize her in case he does go home with drain(s).   Ashok NorrisEmina Corwin Kuiken, [facility]NP-BC Central  Surgery Pager [phone](7A-4:30P)   05/23/2014 8:37 AM

## 2014-05-23 NOTE — Care Management Note (Signed)
Case Management Note  Patient Details  Name: Gladstone PihShannon A Moscato MRN: [id number] Date of Birth: 11/30/1970  Subjective/Objective:             POD#17 laparoscopic appendectomy for perforated appendix. Readmitted 5/11 with Hepatic flexure and lateral abscess. S/p drain to RLQ 5/12 and pelvic drain 5/13.        Action/Plan: Discharge planning. Spoke with patient and mother at bedside regarding d/c plans. Patient deferred to mother. Mother states they are waiting on CT tomorrow to decide if the drains will remain after d/c. If so, they will need HH assistance to care for drains and wound care. Mother wants to wait til after the CT to discuss HH. Discussed doing a benefits check in the meantime to find preferred providers, she is agreeable with this. She states patient has pain with drains and does not think he will be able to navigate stairs, all of the bedrooms are upstairs. May need to move things around to accommodate for him on the ground level of the home. Will f/u tomorrow after CT.  Expected Discharge Date:   (unknown)               Expected Discharge Plan:  Home/Self Care  In-House Referral:  NA  Discharge planning Services  CM Consult  Post Acute Care Choice:    Choice offered to:     DME Arranged:    DME Agency:     HH Arranged:    HH Agency:     Status of Service:  In process, will continue to follow  Medicare Important Message Given:    Date Medicare IM Given:    Medicare IM give by:    Date Additional Medicare IM Given:    Additional Medicare Important Message give by:     If discussed at Long Length of Stay Meetings, dates discussed:    Additional Comments:  Alexis Goodelleele, Sandon Yoho K, RN 05/23/2014, 2:12 PM

## 2014-05-24 ENCOUNTER — Inpatient Hospital Stay (HOSPITAL_COMMUNITY): Payer: BLUE CROSS/BLUE SHIELD

## 2014-05-24 ENCOUNTER — Encounter (HOSPITAL_COMMUNITY): Payer: Self-pay

## 2014-05-24 LAB — CBC
HCT: 30.4 % — ABNORMAL LOW (ref 39.0–52.0)
Hemoglobin: 9.7 g/dL — ABNORMAL LOW (ref 13.0–17.0)
MCH: 27.3 pg (ref 26.0–34.0)
MCHC: 31.9 g/dL (ref 30.0–36.0)
MCV: 85.6 fL (ref 78.0–100.0)
Platelets: 379 10*3/uL (ref 150–400)
RBC: 3.55 MIL/uL — AB (ref 4.22–5.81)
RDW: 13.4 % (ref 11.5–15.5)
WBC: 3.4 10*3/uL — ABNORMAL LOW (ref 4.0–10.5)

## 2014-05-24 LAB — BASIC METABOLIC PANEL
Anion gap: 10 (ref 5–15)
BUN: 12 mg/dL (ref 6–20)
CALCIUM: 8.8 mg/dL — AB (ref 8.9–10.3)
CHLORIDE: 99 mmol/L — AB (ref 101–111)
CO2: 27 mmol/L (ref 22–32)
CREATININE: 0.8 mg/dL (ref 0.61–1.24)
GFR calc non Af Amer: >60 mL/min (ref >60)
Glucose, Bld: 115 mg/dL — ABNORMAL HIGH (ref 65–99)
Potassium: 4.2 mmol/L (ref 3.5–5.1)
Sodium: 136 mmol/L (ref 135–145)

## 2014-05-24 MED ORDER — LORAZEPAM BOLUS VIA INFUSION: 0.5000 mg | Freq: Three times a day (TID) | INTRAVENOUS | Status: DC | PRN

## 2014-05-24 MED ORDER — IOHEXOL 300 MG/ML  SOLN
100.0000 mL | Freq: Once | INTRAMUSCULAR | Status: AC | PRN
  Administered 2014-05-24: 100 mL via INTRAVENOUS

## 2014-05-24 MED ORDER — LORAZEPAM 2 MG/ML IJ SOLN
0.5000 mg | Freq: Three times a day (TID) | INTRAMUSCULAR | Status: DC | PRN
  Administered 2014-05-24: 1 mg via INTRAVENOUS
  Administered 2014-05-25: 0.5 mg via INTRAVENOUS
  Filled 2014-05-24 (×2): qty 1

## 2014-05-24 MED ORDER — IOHEXOL 300 MG/ML  SOLN
50.0000 mL | Freq: Once | INTRAMUSCULAR | Status: AC | PRN
Start: 2014-05-24 — End: 2014-05-24
  Administered 2014-05-24: 10 mL

## 2014-05-24 NOTE — Progress Notes (Signed)
Patient ID: Jeff Hoffman, male   DOB: 29-Oct-1970, 44 y.o.   MRN: [id number]     [facility]      [address] [place], [place] [address]    Phone: [phone] FAX: [phone]     Subjective: VSS.  Afebrile.  WBC normal, renal function is normal.   Painful buttock drain, unable to tolerate flushes.  Had BMs with dulcolax.  Voiding.    Objective:  Vital signs:  Filed Vitals:   05/23/14 0859 05/23/14 1431 05/23/14 2200 05/24/14 0546  BP:  113/57 124/64 114/56  Pulse:  65 69 65  Temp:  97.8 F (36.6 C) 98.3 F (36.8 C) 98.1 F (36.7 C)  TempSrc:  Oral Oral Oral  Resp:  18 18 18   Height:      Weight:      SpO2: 99% 99% 99% 99%    Last BM Date: 05/23/14  Intake/Output   Yesterday:  05/17 0701 - 05/18 0700 In: 2432.5 [P.O.:660; I.V.:1762.5] Out: 1220 [Urine:1200; Drains:20] This shift:    I/O last 3 completed shifts: In: 3362.5 [P.O.:660; I.V.:2662.5; Other:40] Out: 1970 [XBDZH:2992; Drains:45]     Physical Exam: General: Pt awake/alert/oriented x4 in no acute distress Chest: cta bilaterally. No chest wall pain w good excursion CV: Pulses intact. Regular rhythm MS: Normal AROM mjr joints. No obvious deformity Abdomen: Soft. distended. Tender. right lap site-wound is open, purulent drainage is minimal packing replaced, no erythema.  transgluteal drain with serosanguinous output.Marland Kitchen RLQ drain with serous output.  No evidence of peritonitis. No incarcerated hernias. Ext: SCDs BLE. No mjr edema. No cyanosis Skin: No petechiae / purpura   Problem List:   Active Problems:   Intra-abdominal abscess   Abscess of male pelvis    Results:   Labs: Results for orders placed or performed during the hospital encounter of 05/17/14 (from the past 48 hour(s))  CBC     Status: Abnormal   Collection Time: 05/24/14  4:58 AM  Result Value Ref Range   WBC 3.4 (L) 4.0 - 10.5 K/uL   RBC 3.55 (L) 4.22 - 5.81 MIL/uL    Hemoglobin 9.7 (L) 13.0 - 17.0 g/dL   HCT 30.4 (L) 39.0 - 52.0 %   MCV 85.6 78.0 - 100.0 fL   MCH 27.3 26.0 - 34.0 pg   MCHC 31.9 30.0 - 36.0 g/dL   RDW 13.4 11.5 - 15.5 %   Platelets 379 150 - 400 K/uL  Basic metabolic panel     Status: Abnormal   Collection Time: 05/24/14  4:58 AM  Result Value Ref Range   Sodium 136 135 - 145 mmol/L   Potassium 4.2 3.5 - 5.1 mmol/L   Chloride 99 (L) 101 - 111 mmol/L   CO2 27 22 - 32 mmol/L   Glucose, Bld 115 (H) 65 - 99 mg/dL   BUN 12 6 - 20 mg/dL   Creatinine, Ser 0.80 0.61 - 1.24 mg/dL   Calcium 8.8 (L) 8.9 - 10.3 mg/dL   GFR calc non Af Amer >60 >60 mL/min   GFR calc Af Amer >60 >60 mL/min    Comment: (NOTE) The eGFR has been calculated using the CKD EPI equation. This calculation has not been validated in all clinical situations. eGFR's persistently <60 mL/min signify possible Chronic Kidney Disease.    Anion gap 10 5 - 15    Imaging / Studies: No results found.  Medications / Allergies:  Scheduled Meds: . azelastine  1  spray Each Nare BID  . bisacodyl  10 mg Rectal Once  . docusate sodium  100 mg Oral BID  . emtricitabine-tenofovir  1 tablet Oral Daily  . enoxaparin (LOVENOX) injection  30 mg Subcutaneous Q12H  . ertapenem  1 g Intravenous Q24H  . fluticasone  2 spray Each Nare BID  . fluticasone  2 puff Inhalation BID  . magnesium oxide  200 mg Oral Daily  . montelukast  10 mg Oral Daily  . pantoprazole  40 mg Oral Daily  . polyethylene glycol  17 g Oral Daily   Continuous Infusions:  PRN Meds:.acetaminophen **OR** acetaminophen, albuterol, diphenhydrAMINE **OR** diphenhydrAMINE, LORazepam, morphine injection, ondansetron, oxyCODONE  Antibiotics: Anti-infectives    Start     Dose/Rate Route Frequency Ordered Stop   05/18/14 1200  emtricitabine-tenofovir (TRUVADA) 200-300 MG per tablet 1 tablet     1 tablet Oral Daily 05/18/14 0919     05/17/14 1800  ertapenem (INVANZ) 1 g in sodium chloride 0.9 % 50 mL IVPB     1  g 100 mL/hr over 30 Minutes Intravenous Every 24 hours 05/17/14 1626           Assessment/Plan POD#19 laparoscopic appendectomy for perforated appendix with feculent peritonitis by Dr. 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 C/w oxyIR, morphine for breakthrough, DC IVF VTE prophylaxis- SCD/lovenox Dispo-pending CT and pain control  Erby Pian, ANP-BC [facility] Pager [phone](7A-4:30P)   05/24/2014 8:29 AM

## 2014-05-25 MED ORDER — SODIUM CHLORIDE 0.9 % IV SOLN: 1.0000 g | INTRAVENOUS | Status: DC

## 2014-05-25 MED ORDER — OXYCODONE HCL 10 MG PO TABS: 10.0000 mg | ORAL_TABLET | ORAL | Status: DC | PRN

## 2014-05-25 NOTE — Discharge Summary (Signed)
Patient ID: Jeff Hoffman MRN: [id number] DOB/AGE: 46/08/1970 43 y.o.  Admit date: 05/17/2014 Discharge date: 05/25/2014  Procedures: CT guided percutaneous drainage placement x2 Dr. 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 He was discharged from the hospital May 4. He's been on oral Augmentin. He has today she noticed some pinkness and swelling around her small right upper quadrant incision. This had worsened overnight and he ran a fever of 102.5. He's been having some sweats as well and does not feel as well today. He is eating some. His last bowel movement was yesterday.  Admission Diagnoses:  1. Post operative abscesses, s/p lap appy for perforated appendicitis  Hospital Course: The patient was admitted and a CT scan was obtained that revealed intra-abdominal abscesses.  He was placed on IV Invanz at this time.  IR was consulted and the following day 2 percutaneous drains were placed.  He was also noted to have an infection of one of his laparoscopic incisions.  This was opened and packed.  His drains remained in place and once his output minimized, a repeat CT scan was obtained that revealed a resolution of his abscesses 6 days after being placed.  His drains were studied that revealed no fistulas.  His drains were then removed.  His culture revealed E. Coli, but it was resistant to all oral abx agents.  Therefore we are arranging HH for 7 more days of IV Invanz at home.  He is otherwise stable on HD 8 for discharge home.  PE: Abd: soft, appropriately tender, pain improved with drains out, +BS, incisions are c/d/i and one incisions is packed and otherwise clean.   Discharge Diagnoses:  Active Problems:   Intra-abdominal abscess   Abscess of male pelvis POD 20, s/p lap appy for perforated appendicitis  Discharge Medications:   Medication List    STOP taking these medications        amoxicillin-clavulanate  875-125 MG per tablet  Commonly known as:  AUGMENTIN     HYDROcodone-acetaminophen 5-325 MG per tablet  Commonly known as:  NORCO/VICODIN     mineral oil enema      TAKE these medications        acetaminophen 325 MG tablet  Commonly known as:  TYLENOL  Take 1-2 tablets (325-650 mg total) by mouth every 6 (six) hours as needed for fever, headache, mild pain or moderate pain.     acyclovir 400 MG tablet  Commonly known as:  ZOVIRAX  Take 400 mg by mouth 2 (two) times daily. PRN     albuterol 108 (90 BASE) MCG/ACT inhaler  Commonly known as:  PROVENTIL HFA;VENTOLIN HFA  Inhale 2 puffs into the lungs every 6 (six) hours as needed.     ALEVE PO  Take 2 tablets by mouth 2 (two) times daily.     azelastine 0.1 % nasal spray  Commonly known as:  ASTELIN  USE 1 SPRAY IN EACH NOSTRIL TWICE A DAY.     bismuth subsalicylate 262 MG/15ML suspension  Commonly known as:  PEPTO BISMOL  Take 30 mLs by mouth every 6 (six) hours as needed for indigestion.     cetirizine 10 MG tablet  Commonly known as:  ZYRTEC  Take 1 tablet (10 mg total) by mouth daily.     docusate sodium 100 MG capsule  Commonly known as:  COLACE  Take 100 mg by mouth 2 (two) times daily.  ertapenem 1 g in sodium chloride 0.9 % 50 mL  Inject 1 g into the vein daily.     Fish Oil 1000 MG Caps  Take 1 tablet by mouth daily.     fluticasone 50 MCG/ACT nasal spray  Commonly known as:  FLONASE  Place 2 sprays into both nostrils daily.     hydrocortisone cream 0.5 %  Apply 1 application topically daily as needed for itching.     Magnesium 200 MG Tabs  Take 1 tablet by mouth daily.     montelukast 10 MG tablet  Commonly known as:  SINGULAIR  TAKE 1 TABLET ONCE DAILY.     omeprazole 40 MG capsule  Commonly known as:  PRILOSEC  TAKE 1 CAPSULE DAILY.     Oxycodone HCl 10 MG Tabs  Take 1-2 tablets (10-20 mg total) by mouth every 4 (four) hours as needed for moderate pain.     pseudoephedrine 60 MG tablet   Commonly known as:  SUDAFED  Take 60 mg by mouth every 4 (four) hours as needed for congestion.     QVAR 80 MCG/ACT inhaler  Generic drug:  beclomethasone  USE 1 PUFF TWICE A DAY. RINSE MOUTH.     saccharomyces boulardii 250 MG capsule  Commonly known as:  FLORASTOR  Take 1 capsule (250 mg total) by mouth 2 (two) times daily.     simethicone 80 MG chewable tablet  Commonly known as:  MYLICON  Chew 80 mg by mouth every 6 (six) hours as needed for flatulence.     TRUVADA 200-300 MG per tablet  Generic drug:  emtricitabine-tenofovir  TAKE 1 TABLET DAILY.        Discharge Instructions:     Follow-up Information    Follow up with ROSENBOWER,TODD J, MD. Schedule an appointment as soon as possible for a visit in 2 weeks.   Specialty:  General Surgery   Contact information:   [address] [place] [address] [phone]       Signed: Letha CapeOSBORNE,Aira Sallade E 05/25/2014, 11:30 AM

## 2014-05-25 NOTE — Discharge Instructions (Signed)
Change dressing to abdominal wound daily with 1/4' iodoform gauze  7 days of IV Invanz to be given

## 2014-05-25 NOTE — Progress Notes (Signed)
Assessment unchanged. Pt and mother verbalized understanding of dc instructions through teach back including medications to resume, follow up care and plan for outpatient IV antibiotics as well as dressing changes over next 7 days in Short Stay. Script x 1 provided for po analgesic by MD. Discharged via wc to front entrance to meet awaiting vehicle to carry home. Accompanied by NT and mother.

## 2014-05-25 NOTE — Progress Notes (Signed)
Spoke with patient and mother at bedside regarding d/c plans. Mother is not able to assist with IV therapy and there is not a teachable caregiver available. Discussed options with them and they wanted to pursue coming in short stay to get dressings and IV abx. Spoke with Tresa EndoKelly PA, discussed with Marylu LundJanet in short stay. Arrangements made for patient to come daily to short stay to receive treatments. Provided mother with schedule of appts, [facility], updated bedside nurses on d/c plan. Patient and mother very appreciative of efforts and accommodations. Plan for d/c today.

## 2014-05-26 ENCOUNTER — Encounter (HOSPITAL_COMMUNITY)
Admit: 2014-05-26 | Discharge: 2014-05-26 | Disposition: A | Discharge status: Home / Self Care | Payer: BLUE CROSS/BLUE SHIELD | Source: Ambulatory Visit | Attending: General Surgery | Admitting: General Surgery

## 2014-05-26 DIAGNOSIS — K651 Peritoneal abscess: Secondary | ICD-10-CM | POA: Insufficient documentation

## 2014-05-26 DIAGNOSIS — Z1620 Resistance to unspecified antibiotic: Secondary | ICD-10-CM | POA: Insufficient documentation

## 2014-05-26 DIAGNOSIS — Z4801 Encounter for change or removal of surgical wound dressing: Secondary | ICD-10-CM | POA: Diagnosis not present

## 2014-05-26 DIAGNOSIS — B9620 Unspecified Escherichia coli [E. coli] as the cause of diseases classified elsewhere: Secondary | ICD-10-CM | POA: Insufficient documentation

## 2014-05-26 MED ORDER — SODIUM CHLORIDE 0.9 % IV SOLN
1.0000 g | Freq: Every day | INTRAVENOUS | Status: DC
  Administered 2014-05-26: 1 g via INTRAVENOUS
  Filled 2014-05-26 (×2): qty 1

## 2014-05-26 MED ORDER — SODIUM CHLORIDE 0.9 % IJ SOLN
3.0000 mL | Freq: Every day | INTRAMUSCULAR | Status: DC
  Administered 2014-05-26: 3 mL

## 2014-05-26 MED ORDER — SODIUM CHLORIDE 0.9 % IV SOLN
Freq: Every day | INTRAVENOUS | Status: DC
  Administered 2014-05-26: 250 mL via INTRAVENOUS

## 2014-05-26 NOTE — Progress Notes (Signed)
Pt arrived today with Mom for infusion of INVANZ 1 GM and RLQ abd dressing change. Pt had removed dressing at home and removed "wick" showered and replaced with gauze and hypafix tape over site as had been instructed with his IP discharge instructions. Dressing was removed 1cm open area, no drainage noted, was packed with 2 inches of iodoform gauze and skin prep applied to outline area where tape to be applied ( Pt has some healing dried blister areas "from tape from previous dressings". Gauze applied and hypafix tape.

## 2014-05-26 NOTE — Discharge Instructions (Signed)
INVANZ Ertapenem injection What is this medicine? ERTAPENEM (er ta PEN em) is a carbapenem antibiotic. It is used to treat certain kinds of bacterial infections. It will not work for colds, flu, or other viral infections. This medicine may be used for other purposes; ask your health care provider or pharmacist if you have questions. COMMON BRAND NAME(S): Invanz What should I tell my health care provider before I take this medicine? They need to know if you have any of these conditions: -brain tumor, lesion -kidney disease -seizure disorder -an unusual or allergic reaction to ertapenem, other antibiotics, amide local anesthetics like lidocaine, or other medicines, foods, dyes, or preservatives -pregnant or trying to get pregnant -breast-feeding How should I use this medicine? This medicine is infused into a vein or injected deep into a muscle. It is usually given by a health care professional in a hospital or clinic setting. If you get this medicine at home, you will be taught how to prepare and give this medicine. Use exactly as directed. Take your medicine at regular intervals. Do not take your medicine more often than directed. It is important that you put your used needles and syringes in a special sharps container. Do not put them in a trash can. If you do not have a sharps container, call your pharmacist or healthcare provider to get one. Talk to your pediatrician regarding the use of this medicine in children. While this drug may be prescribed for children as young as 773 months old for selected conditions, precautions do apply. Overdosage: If you think you have taken too much of this medicine contact a poison control center or emergency room at once. NOTE: This medicine is only for you. Do not share this medicine with others. What if I miss a dose? If you miss a dose, take it as soon as you can. If it is almost time for your next dose, take only that dose. Do not take double or extra  doses. What may interact with this medicine? -birth control pills -probenecid This list may not describe all possible interactions. Give your health care provider a list of all the medicines, herbs, non-prescription drugs, or dietary supplements you use. Also tell them if you smoke, drink alcohol, or use illegal drugs. Some items may interact with your medicine. What should I watch for while using this medicine? Tell your doctor or health care professional if your symptoms do not improve or if you get new symptoms. Your doctor will monitor your condition and blood work as needed. Do not treat diarrhea with over the counter products. Contact your doctor if you have diarrhea that lasts more than 2 days or if it is severe and watery. What side effects may I notice from receiving this medicine? Side effects that you should report to your doctor or health care professional as soon as possible: -allergic reactions like skin rash, itching or hives, swelling of the face, lips, or tongue -anxiety, confusion, dizzy -chest pain -difficulty breathing, wheezing -edema, swelling -fever -irregular heart rate, blood pressure -pain or difficulty passing urine -seizures -unusually weak or tired -white or red patches in mouth Side effects that usually do not require medical attention (report to your doctor or health care professional if they continue or are bothersome): -constipation or diarrhea -difficulty sleeping -headache -nausea, vomiting -pain, swelling or irritation where injected -stomach upset -vaginal itch, irritation This list may not describe all possible side effects. Call your doctor for medical advice about side effects. You may report side  effects to FDA at 1-800-FDA-1088. Where should I keep my medicine? Keep out of the reach of children. You will be instructed on how to store this medicine. Throw away any unused medicine after the expiration date on the label. NOTE: This sheet is a  summary. It may not cover all possible information. If you have questions about this medicine, talk to your doctor, pharmacist, or health care provider.  2015, Elsevier/Gold Standard. (2012-07-30 07:36:44)

## 2014-05-27 ENCOUNTER — Encounter (HOSPITAL_COMMUNITY)
Admit: 2014-05-27 | Discharge: 2014-05-27 | Disposition: A | Discharge status: Home / Self Care | Payer: BLUE CROSS/BLUE SHIELD | Attending: General Surgery | Admitting: General Surgery

## 2014-05-27 DIAGNOSIS — K651 Peritoneal abscess: Secondary | ICD-10-CM | POA: Diagnosis not present

## 2014-05-27 MED ORDER — SODIUM CHLORIDE 0.9 % IJ SOLN: 3.0000 mL | Freq: Every day | INTRAMUSCULAR | Status: DC

## 2014-05-27 MED ORDER — SODIUM CHLORIDE 0.9 % IV SOLN: Freq: Every day | INTRAVENOUS | Status: DC

## 2014-05-27 MED ORDER — SODIUM CHLORIDE 0.9 % IV SOLN
1.0000 g | Freq: Every day | INTRAVENOUS | Status: DC
  Filled 2014-05-27 (×2): qty 1

## 2014-05-27 NOTE — Progress Notes (Signed)
Pt was given Invanz per saline lock in left post forearm.  dsg changed to abd using approx 3 inches of iodoform and 1 4x4 covering wound.  Tissue is beefy red w/ minimal dsg on old dsg.  Pt tolerated procedures well.  Pt discharged at 1120  Via ambulatory.  VSS

## 2014-05-28 ENCOUNTER — Encounter (HOSPITAL_COMMUNITY)
Admit: 2014-05-28 | Discharge: 2014-05-28 | Disposition: A | Discharge status: Home / Self Care | Payer: BLUE CROSS/BLUE SHIELD | Attending: General Surgery | Admitting: General Surgery

## 2014-05-28 DIAGNOSIS — K651 Peritoneal abscess: Secondary | ICD-10-CM | POA: Diagnosis not present

## 2014-05-28 MED ORDER — SODIUM CHLORIDE 0.9 % IV SOLN
1.0000 g | Freq: Once | INTRAVENOUS | Status: DC
  Filled 2014-05-28: qty 1

## 2014-05-28 NOTE — Progress Notes (Signed)
130  patient to floor for daily invanz dose outpatient. Iv site left forearm intact. VSS with S/o at bedside D Susann GivensFranklin RN

## 2014-05-28 NOTE — Progress Notes (Signed)
Wound to right abdomen repacked with iodoform strips and gauze and tape applied D [employer] Data SystemsFranklin RN

## 2014-05-29 ENCOUNTER — Encounter (HOSPITAL_COMMUNITY)
Admit: 2014-05-29 | Discharge: 2014-05-29 | Disposition: A | Discharge status: Home / Self Care | Payer: BLUE CROSS/BLUE SHIELD | Attending: General Surgery | Admitting: General Surgery

## 2014-05-29 ENCOUNTER — Encounter (HOSPITAL_COMMUNITY): Payer: Self-pay

## 2014-05-29 DIAGNOSIS — K651 Peritoneal abscess: Secondary | ICD-10-CM | POA: Diagnosis not present

## 2014-05-29 MED ORDER — SODIUM CHLORIDE 0.9 % IV SOLN
Freq: Every day | INTRAVENOUS | Status: DC
  Administered 2014-05-29: 15:00:00 via INTRAVENOUS

## 2014-05-29 MED ORDER — SODIUM CHLORIDE 0.9 % IJ SOLN
3.0000 mL | Freq: Every day | INTRAMUSCULAR | Status: DC
Start: 2014-05-29 — End: 2014-05-30
  Administered 2014-05-29 (×2): 3 mL via INTRAVENOUS

## 2014-05-29 MED ORDER — SODIUM CHLORIDE 0.9 % IV SOLN
1.0000 g | Freq: Every day | INTRAVENOUS | Status: DC
  Administered 2014-05-29: 1 g via INTRAVENOUS
  Filled 2014-05-29 (×2): qty 1

## 2014-05-29 NOTE — Progress Notes (Signed)
Gauze dressing removed to right abdomen. Pt states he removed packing in the shower today and placed gauze over site upon finishing with shower.  Cleansed skin around wound with alcohol pad, betadine pad and then patted dry with gauze.  Applied skin protectant wipe around wound where tape would be applied.  Packed wound with iodoform gauze, cut about a 3 inch strip and was able to get about 2 inches of it inside the wound.  Left the rest and covered with dry gauze and medipore tape.  Pt had mild discomfort during dressing change, but tolerated well.

## 2014-05-30 ENCOUNTER — Encounter (HOSPITAL_COMMUNITY): Payer: Self-pay

## 2014-05-30 ENCOUNTER — Encounter (HOSPITAL_COMMUNITY)
Admit: 2014-05-30 | Discharge: 2014-05-30 | Disposition: A | Discharge status: Home / Self Care | Payer: BLUE CROSS/BLUE SHIELD | Attending: General Surgery | Admitting: General Surgery

## 2014-05-30 DIAGNOSIS — K651 Peritoneal abscess: Secondary | ICD-10-CM | POA: Diagnosis not present

## 2014-05-30 MED ORDER — SODIUM CHLORIDE 0.9 % IV SOLN
1.0000 g | Freq: Every day | INTRAVENOUS | Status: DC
  Administered 2014-05-30: 1 g via INTRAVENOUS
  Filled 2014-05-30 (×2): qty 1

## 2014-05-30 MED ORDER — SODIUM CHLORIDE 0.9 % IJ SOLN
3.0000 mL | Freq: Every day | INTRAMUSCULAR | Status: DC
  Administered 2014-05-30 (×2): 3 mL via INTRAVENOUS

## 2014-05-30 MED ORDER — SODIUM CHLORIDE 0.9 % IV SOLN
Freq: Every day | INTRAVENOUS | Status: DC
  Administered 2014-05-30: 15:00:00 via INTRAVENOUS

## 2014-05-30 NOTE — Progress Notes (Signed)
Gauze dressing removed from 0.5cm diameter wide and deep right upper quad abd wound. Small amount sanguinous drainage noted.  Cleansed skin with alcohol pad and skin prep applied. Iodoform packing inserted and area covered with gauze dressing and medipore. Pt had pain "2" of 10 during dressing change and denies pain post.

## 2014-05-31 ENCOUNTER — Encounter (HOSPITAL_COMMUNITY)
Admit: 2014-05-31 | Discharge: 2014-05-31 | Disposition: A | Discharge status: Home / Self Care | Payer: BLUE CROSS/BLUE SHIELD | Attending: General Surgery | Admitting: General Surgery

## 2014-05-31 ENCOUNTER — Encounter (HOSPITAL_COMMUNITY): Payer: Self-pay

## 2014-05-31 DIAGNOSIS — K651 Peritoneal abscess: Secondary | ICD-10-CM | POA: Diagnosis not present

## 2014-05-31 MED ORDER — SODIUM CHLORIDE 0.9 % IJ SOLN
3.0000 mL | Freq: Every day | INTRAMUSCULAR | Status: AC
  Administered 2014-05-31: 3 mL via INTRAVENOUS

## 2014-05-31 MED ORDER — SODIUM CHLORIDE 0.9 % IV SOLN
Freq: Every day | INTRAVENOUS | Status: DC
  Administered 2014-05-31: 15:00:00 via INTRAVENOUS

## 2014-05-31 MED ORDER — SODIUM CHLORIDE 0.9 % IV SOLN
1.0000 g | Freq: Every day | INTRAVENOUS | Status: DC
  Administered 2014-05-31: 1 g via INTRAVENOUS
  Filled 2014-05-31 (×2): qty 1

## 2014-05-31 NOTE — Progress Notes (Addendum)
Patient voices concern that his last day of antibiotics is 06/02/14 and he does not know how to do his dressing change. He has taken a shower at home and removed the packing prior to shower. Using table top mirror and looking down at his wound, he is able to see wound. Using sterile Q tip, wound is 0.5cm in depth and width. No drainage noted on gauze but serosanguinous drainage on Q tip. Patient instructed to cut off about 1 inch of  iodoform packing strip (1/4") and use sterile Q tip to pack wound. Only able to pack about 1/8th inch into wound. He covers packing with 2x2 gauze and uses medipore tape to cover. He states he feels so much better about doing dressing change at home now.

## 2014-06-01 ENCOUNTER — Encounter (HOSPITAL_COMMUNITY): Payer: Self-pay

## 2014-06-01 ENCOUNTER — Encounter (HOSPITAL_COMMUNITY)
Admit: 2014-06-01 | Discharge: 2014-06-01 | Disposition: A | Discharge status: Home / Self Care | Payer: BLUE CROSS/BLUE SHIELD | Attending: General Surgery | Admitting: General Surgery

## 2014-06-01 DIAGNOSIS — K651 Peritoneal abscess: Secondary | ICD-10-CM | POA: Diagnosis not present

## 2014-06-01 MED ORDER — SODIUM CHLORIDE 0.9 % IJ SOLN
3.0000 mL | Freq: Every day | INTRAMUSCULAR | Status: AC
  Administered 2014-06-01: 3 mL via INTRAVENOUS

## 2014-06-01 MED ORDER — SODIUM CHLORIDE 0.9 % IV SOLN
1.0000 g | Freq: Every day | INTRAVENOUS | Status: DC
  Administered 2014-06-01: 1 g via INTRAVENOUS
  Filled 2014-06-01 (×2): qty 1

## 2014-06-01 MED ORDER — SODIUM CHLORIDE 0.9 % IV SOLN
Freq: Every day | INTRAVENOUS | Status: DC
  Administered 2014-06-01: 250 mL via INTRAVENOUS

## 2014-06-01 NOTE — Progress Notes (Signed)
Pt arrived today for his ABX infusion and dressing change. He had showered prior to arrival and removed iodoform gauze. He is again willing to change the dressing and do the packing under observation by nurse. Pt washed hands and dons non-sterile gloves. Cleaned around wound with aocohol. Wound is still 0.5cm in width and depth and no drainage but some serosang noted when he was using the sterile q-tip to pack the 1/4 inch iodoform into wound. Able to pack into wound 1 inch,covered with sterile folded 2x2 gauze. Skin prep around wound and hypafix tape applied. Pt states he will do the packing thru the weekend and will contact his surgeon as to whether he is to continue thru next week since his appointment is week after.

## 2014-06-02 ENCOUNTER — Encounter (HOSPITAL_COMMUNITY): Payer: Self-pay

## 2014-06-02 ENCOUNTER — Encounter (HOSPITAL_COMMUNITY)
Admit: 2014-06-02 | Discharge: 2014-06-02 | Disposition: A | Discharge status: Home / Self Care | Payer: BLUE CROSS/BLUE SHIELD | Attending: General Surgery | Admitting: General Surgery

## 2014-06-02 DIAGNOSIS — K651 Peritoneal abscess: Secondary | ICD-10-CM | POA: Diagnosis not present

## 2014-06-02 MED ORDER — SODIUM CHLORIDE 0.9 % IV SOLN
1.0000 g | Freq: Every day | INTRAVENOUS | Status: AC
  Administered 2014-06-02: 1 g via INTRAVENOUS
  Filled 2014-06-02: qty 1

## 2014-06-02 MED ORDER — SODIUM CHLORIDE 0.9 % IV SOLN
Freq: Every day | INTRAVENOUS | Status: AC
  Administered 2014-06-02: 250 mL via INTRAVENOUS

## 2014-06-02 NOTE — Progress Notes (Signed)
Pt arrived today for #7/7 dose of IV INVANZ. He has showered prior to arrival and removed the iodoform packing in abd wound and covered with sterile 2x2 and hypafix tape. While here in Short stay dressing was removed and small amount serosang drainage from site. Site is currently 0.5 cm in diameter. Pt demonstrated correct techniques in washing hands, donning gloves, cleaning around site and packing with 1/4 inch iodoform gauze of approx 1/2 inch into wound using sterile q-tip, skin prep to skin and placing sterile folded 2x2 and hypafix . Pt will continue to dress and pack site until he contacts office on Tuesday (06/06/14) for further instructions. And will contact them for any questions or concerns

## 2014-06-14 LAB — FUNGUS CULTURE W SMEAR: Fungal Smear: NONE SEEN

## 2014-07-05 ENCOUNTER — Other Ambulatory Visit: Payer: Self-pay | Admitting: Family Medicine

## 2014-07-05 NOTE — Telephone Encounter (Signed)
It's time for blood work. Make sure he has enough until he can be seen

## 2014-07-05 NOTE — Telephone Encounter (Signed)
Is this okay?

## 2014-07-13 ENCOUNTER — Encounter: Payer: Self-pay | Admitting: Family Medicine

## 2014-07-13 ENCOUNTER — Ambulatory Visit (INDEPENDENT_AMBULATORY_CARE_PROVIDER_SITE_OTHER): Payer: BLUE CROSS/BLUE SHIELD | Admitting: Family Medicine

## 2014-07-13 VITALS — BP 116/70 | HR 70 | Wt 206.0 lb

## 2014-07-13 DIAGNOSIS — Z209 Contact with and (suspected) exposure to unspecified communicable disease: Secondary | ICD-10-CM

## 2014-07-13 LAB — HIV ANTIBODY (ROUTINE TESTING W REFLEX): HIV 1&2 Ab, 4th Generation: NONREACTIVE

## 2014-07-13 NOTE — Progress Notes (Signed)
   Subjective:    Patient ID: Jeff Hoffman, male    DOB: 03/09/1970, 44 y.o.   MRN: [id number]  HPI He is here for follow-up on Truvada. He continues on the medication without difficulty. His last unprotected sexual encounter was in April.   Review of Systems     Objective:   Physical Exam Alert and in no distress otherwise not examined       Assessment & Plan:  Contact with or exposure to communicable disease - Plan: RPR, HIV antibody Again reinforced the need for the use of condoms. Suggested using a nonlatex condom to see if that will help as he states that regular condoms cause discomfort.

## 2014-07-14 ENCOUNTER — Telehealth: Payer: Self-pay | Admitting: Family Medicine

## 2014-07-14 LAB — RPR

## 2014-07-14 NOTE — Telephone Encounter (Signed)
Let him know that it is really not indicated and his insurance might not cover it.

## 2014-07-14 NOTE — Telephone Encounter (Addendum)
Pt wants to come in for meningococcal vaccine on 07/20/14. Is this OK? Call pt to let him know if this is ok

## 2014-07-17 NOTE — Telephone Encounter (Signed)
Patient informed and he is  wanting vac he will check with ins. Company and if they will pay he will call for nurse visit

## 2014-07-20 ENCOUNTER — Encounter: Payer: Self-pay | Admitting: Family Medicine

## 2014-07-24 ENCOUNTER — Encounter: Payer: Self-pay | Admitting: Family Medicine

## 2014-07-31 ENCOUNTER — Encounter: Payer: Self-pay | Admitting: Family Medicine

## 2014-07-31 ENCOUNTER — Ambulatory Visit (INDEPENDENT_AMBULATORY_CARE_PROVIDER_SITE_OTHER): Payer: BLUE CROSS/BLUE SHIELD | Admitting: Family Medicine

## 2014-07-31 VITALS — BP 120/70 | HR 83 | Wt 211.0 lb

## 2014-07-31 DIAGNOSIS — Z23 Encounter for immunization: Secondary | ICD-10-CM | POA: Diagnosis not present

## 2014-07-31 DIAGNOSIS — G8929 Other chronic pain: Secondary | ICD-10-CM | POA: Diagnosis not present

## 2014-07-31 DIAGNOSIS — M549 Dorsalgia, unspecified: Secondary | ICD-10-CM | POA: Diagnosis not present

## 2014-07-31 NOTE — Progress Notes (Signed)
   Subjective:    Patient ID: Jeff Hoffman, male    DOB: [birth year], 44 y.o.   MRN: [id number]  HPI He is here for consult concerning continued difficulty with back pain. He had a discectomy and laminectomy in 2004 by Dr Lerry Paterson. He did relatively well however in the last 4 years he has been getting radiofrequency ablation done on a regular basis. He is having this done by Dr. Modesto Charon. The ablation is not successful. He would like to look into other days. He is interested in some of the newer surgical techniques and would like to be referred for consultation concerning this. Apparently his last MRI was 3 years ago.He would also like a meningococcal vaccine.  Review of Systems     Objective:   Physical Exam Alert and in no distress otherwise not examined       Assessment & Plan:  Chronic back pain - Plan: Ambulatory referral to Neurosurgery  Need for meningococcal vaccination - Plan: Meningococcal conjugate vaccine 4-valent IM, CANCELED: Meningococcal polysaccharide vaccine subcutaneous  apparently Dr. Trey Sailors does do a particular surgical techniques that he is interested in pursuing. I will therefore refer him there.

## 2014-09-26 ENCOUNTER — Encounter: Payer: Self-pay | Admitting: Family Medicine

## 2014-09-26 ENCOUNTER — Other Ambulatory Visit: Payer: Self-pay | Admitting: Family Medicine

## 2014-09-26 ENCOUNTER — Ambulatory Visit (INDEPENDENT_AMBULATORY_CARE_PROVIDER_SITE_OTHER): Payer: BLUE CROSS/BLUE SHIELD | Admitting: Family Medicine

## 2014-09-26 VITALS — BP 118/78 | HR 80 | Ht 73.0 in | Wt 213.0 lb

## 2014-09-26 DIAGNOSIS — G8929 Other chronic pain: Secondary | ICD-10-CM

## 2014-09-26 DIAGNOSIS — Z23 Encounter for immunization: Secondary | ICD-10-CM

## 2014-09-26 DIAGNOSIS — M549 Dorsalgia, unspecified: Secondary | ICD-10-CM | POA: Diagnosis not present

## 2014-09-26 DIAGNOSIS — K219 Gastro-esophageal reflux disease without esophagitis: Secondary | ICD-10-CM

## 2014-09-26 DIAGNOSIS — Z209 Contact with and (suspected) exposure to unspecified communicable disease: Secondary | ICD-10-CM

## 2014-09-26 DIAGNOSIS — J3089 Other allergic rhinitis: Secondary | ICD-10-CM

## 2014-09-26 MED ORDER — TRAMADOL HCL 50 MG PO TABS: 50.0000 mg | ORAL_TABLET | Freq: Three times a day (TID) | ORAL | Status: DC | PRN

## 2014-09-26 NOTE — Progress Notes (Signed)
   Subjective:    Patient ID: Jeff Hoffman, male    DOB: 08-21-1970, 44 y.o.   MRN: [id number]  HPI He is here for an interval evaluation. He is scheduled for back surgery on October 24. In the past he has had epidural injections as well as nerve blocking, none which has been overly successful. He is using Aleve as well as Tylenol and having difficulty with sleep. He would like something stronger until after surgery. He is not interested in codeine preparations. He also would like to be STD tested. He has noted some rectal discharge after sexual activity that occurred several weeks ago. He continues on prep. He does have underlying reflux disease and uses Prilosec on a daily basis. He has tried to cut back but has been unsuccessful. He continues on his allergy/asthma medications. He rarely uses albuterol.   Review of Systems     Objective:   Physical Exam Alert and in no distress. Rectal cultures were taken.       Assessment & Plan:  Contact with or exposure to communicable disease - Plan: HIV antibody, RPR, CANCELED: GC/Chlamydia Probe Amp  Needs flu shot - Plan: Flu Vaccine QUAD 36+ mos PF IM (Fluarix & Fluzone Quad PF)  Chronic back pain - Plan: traMADol (ULTRAM) 50 MG tablet  Gastroesophageal reflux disease without esophagitis  Other allergic rhinitis I discussed pain management with him. I will give him tramadol but have been using Aleve first as well as Tylenol. He will continue on Prilosec as well as his allergy/asthma medications as he is using these appropriately. Flu shot given. Over 25 minutes Greater than 50% spent in counseling and coordination of care with him.

## 2014-09-27 LAB — RPR

## 2014-09-27 LAB — HIV ANTIBODY (ROUTINE TESTING W REFLEX): HIV 1&2 Ab, 4th Generation: NONREACTIVE

## 2014-09-29 LAB — CT/NG RNA, TMA RECTAL
CHLAMYDIA TRACHOMATIS RNA: NOT DETECTED
Neisseria Gonorrhoeae RNA: NOT DETECTED

## 2014-10-04 ENCOUNTER — Other Ambulatory Visit: Payer: Self-pay | Admitting: Family Medicine

## 2014-10-04 NOTE — Telephone Encounter (Signed)
Are these okay to refill? 

## 2014-10-05 ENCOUNTER — Ambulatory Visit (INDEPENDENT_AMBULATORY_CARE_PROVIDER_SITE_OTHER): Payer: BLUE CROSS/BLUE SHIELD | Admitting: Family Medicine

## 2014-10-05 ENCOUNTER — Encounter: Payer: Self-pay | Admitting: Family Medicine

## 2014-10-05 VITALS — BP 130/80 | HR 70 | Temp 98.2°F | Resp 12 | Wt 214.6 lb

## 2014-10-05 DIAGNOSIS — J301 Allergic rhinitis due to pollen: Secondary | ICD-10-CM

## 2014-10-05 DIAGNOSIS — J4520 Mild intermittent asthma, uncomplicated: Secondary | ICD-10-CM

## 2014-10-05 DIAGNOSIS — J209 Acute bronchitis, unspecified: Secondary | ICD-10-CM | POA: Diagnosis not present

## 2014-10-05 MED ORDER — AMOXICILLIN 875 MG PO TABS: 875.0000 mg | ORAL_TABLET | Freq: Two times a day (BID) | ORAL | Status: DC

## 2014-10-05 MED ORDER — ALBUTEROL SULFATE 108 (90 BASE) MCG/ACT IN AEPB: 2.0000 | INHALATION_SPRAY | Freq: Four times a day (QID) | RESPIRATORY_TRACT | Status: AC | PRN

## 2014-10-05 NOTE — Progress Notes (Signed)
   Subjective:    Patient ID: Jeff Hoffman, male    DOB: [birth year], 44 y.o.   MRN: [id number]  HPI He complains of a ten-day history this started with nasal congestion, PND slight sore throat. This is developed into a cough that has now become productive. No fever, chills, earache or abdominal symptoms. He does not smoke. He does have underlying allergies and occasionally has difficulty with asthma.   Review of Systems     Objective:   Physical Exam Alert and in no distress. Tympanic membranes and canals are normal. Pharyngeal area is normal. Neck is supple without adenopathy or thyromegaly. Cardiac exam shows a regular sinus rhythm without murmurs or gallops. Lungs show scattered wheezing. No tenderness palpation over her sinuses and the nasal mucosa is normal.        Assessment & Plan:  Allergic rhinitis due to pollen  Acute bronchitis, unspecified organism - Plan: amoxicillin (AMOXIL) 875 MG tablet  Asthma, chronic, mild intermittent, uncomplicated - Plan: Albuterol Sulfate (PROAIR RESPICLICK) 108 (90 BASE) MCG/ACT AEPB  he will call if further trouble.

## 2014-11-02 ENCOUNTER — Telehealth: Payer: Self-pay | Admitting: Family Medicine

## 2014-11-02 DIAGNOSIS — G8929 Other chronic pain: Secondary | ICD-10-CM

## 2014-11-02 DIAGNOSIS — M549 Dorsalgia, unspecified: Principal | ICD-10-CM

## 2014-11-02 MED ORDER — TRAMADOL HCL 50 MG PO TABS: 50.0000 mg | ORAL_TABLET | Freq: Three times a day (TID) | ORAL | Status: DC | PRN

## 2014-11-02 NOTE — Telephone Encounter (Signed)
Okay to refill? 

## 2014-11-02 NOTE — Telephone Encounter (Signed)
Pt called and was requesting a refill on his tramadol, he was suppose to have surgery last week but he is waiting on his insurance, so he has to wait a couple of weeks for his surgery now and his was wondering if he could get a refill on his pain medicine, pt can be reached at [phone]. To let him know when ready to be picked up

## 2014-11-02 NOTE — Telephone Encounter (Signed)
Called med into the pharmacy

## 2014-12-04 ENCOUNTER — Other Ambulatory Visit: Payer: Self-pay

## 2014-12-04 ENCOUNTER — Telehealth: Payer: Self-pay | Admitting: Family Medicine

## 2014-12-04 DIAGNOSIS — G8929 Other chronic pain: Secondary | ICD-10-CM

## 2014-12-04 DIAGNOSIS — M549 Dorsalgia, unspecified: Principal | ICD-10-CM

## 2014-12-04 MED ORDER — TRAMADOL HCL 50 MG PO TABS: 50.0000 mg | ORAL_TABLET | Freq: Three times a day (TID) | ORAL | Status: DC | PRN

## 2014-12-04 NOTE — Telephone Encounter (Signed)
Called in tramadol per [facility]

## 2014-12-04 NOTE — Telephone Encounter (Signed)
Pt called.  He is still waiting on insurance to ok his back surgery so he needs another refill of Tramadol.

## 2014-12-04 NOTE — Telephone Encounter (Signed)
Okay to refill? 

## 2014-12-25 ENCOUNTER — Telehealth: Payer: Self-pay | Admitting: Family Medicine

## 2014-12-25 NOTE — Telephone Encounter (Signed)
Okay to call this in.

## 2014-12-25 NOTE — Telephone Encounter (Signed)
Called in.

## 2014-12-25 NOTE — Telephone Encounter (Signed)
Pt called and states still waiting insurance approval and needs another rx for Tramadol to [facility]

## 2014-12-28 ENCOUNTER — Other Ambulatory Visit: Payer: Self-pay | Admitting: Family Medicine

## 2014-12-28 NOTE — Telephone Encounter (Signed)
Ok to refill 

## 2014-12-28 NOTE — Telephone Encounter (Signed)
He needs to come in for blood work 

## 2014-12-29 NOTE — Telephone Encounter (Signed)
LM to CB and schedule blood work

## 2015-01-03 ENCOUNTER — Other Ambulatory Visit: Payer: BLUE CROSS/BLUE SHIELD

## 2015-01-03 DIAGNOSIS — Z209 Contact with and (suspected) exposure to unspecified communicable disease: Secondary | ICD-10-CM

## 2015-01-04 LAB — RPR

## 2015-01-04 LAB — HIV ANTIBODY (ROUTINE TESTING W REFLEX): HIV: NONREACTIVE

## 2015-01-25 ENCOUNTER — Telehealth: Payer: Self-pay | Admitting: Family Medicine

## 2015-01-25 ENCOUNTER — Other Ambulatory Visit: Payer: Self-pay | Admitting: *Deleted

## 2015-01-25 DIAGNOSIS — G8929 Other chronic pain: Secondary | ICD-10-CM

## 2015-01-25 DIAGNOSIS — M549 Dorsalgia, unspecified: Principal | ICD-10-CM

## 2015-01-25 MED ORDER — TRAMADOL HCL 50 MG PO TABS: 50.0000 mg | ORAL_TABLET | Freq: Three times a day (TID) | ORAL | Status: DC | PRN

## 2015-01-25 NOTE — Telephone Encounter (Signed)
Medication called to pharmacy. 

## 2015-01-25 NOTE — Telephone Encounter (Signed)
Pt called and stated that he is seeing Dr. Dutch Quint with [facility]. They are going to pursue surgery. He wanted to make sure you were aware. ALSO pt needs a refill on Tramadol sent to [facility]. Pt can be reached at [phone].

## 2015-01-25 NOTE — Telephone Encounter (Signed)
Okay to renew tramadol 

## 2015-02-13 ENCOUNTER — Other Ambulatory Visit: Payer: Self-pay | Admitting: Family Medicine

## 2015-02-13 ENCOUNTER — Telehealth: Payer: Self-pay | Admitting: Family Medicine

## 2015-02-13 NOTE — Telephone Encounter (Signed)
Pt needs refill Tramadol to [facility]

## 2015-02-13 NOTE — Telephone Encounter (Signed)
Okay to renew

## 2015-02-13 NOTE — Telephone Encounter (Signed)
Yes okay to refill  

## 2015-02-14 ENCOUNTER — Telehealth: Payer: Self-pay | Admitting: Family Medicine

## 2015-02-14 NOTE — Telephone Encounter (Signed)
Ultram phoned in to [facility] per [employer]

## 2015-03-19 ENCOUNTER — Other Ambulatory Visit: Payer: Self-pay | Admitting: Family Medicine

## 2015-03-19 ENCOUNTER — Telehealth: Payer: Self-pay | Admitting: Family Medicine

## 2015-03-19 MED ORDER — TRAMADOL HCL 50 MG PO TABS: ORAL_TABLET | ORAL | Status: DC

## 2015-03-19 NOTE — Telephone Encounter (Signed)
Phoned in.

## 2015-03-19 NOTE — Telephone Encounter (Signed)
He needs an ointment at the end of the month or in April for follow-up

## 2015-03-19 NOTE — Telephone Encounter (Signed)
Is this okay to refill? 

## 2015-03-19 NOTE — Telephone Encounter (Signed)
Okay to call in. 

## 2015-03-19 NOTE — Telephone Encounter (Signed)
Pt called for refill of Tramadol. Pt states that this will be last one and it is for just in case. Pt states that he recently had a procedure at pain management that seems to be working however [place] and would like to take some with him. [facility] and can be reached at [phone].

## 2015-03-20 ENCOUNTER — Other Ambulatory Visit: Payer: Self-pay | Admitting: Family Medicine

## 2015-03-20 NOTE — Telephone Encounter (Signed)
Is this okay to refill? 

## 2015-04-17 ENCOUNTER — Other Ambulatory Visit: Payer: Self-pay

## 2015-04-17 ENCOUNTER — Telehealth: Payer: Self-pay | Admitting: Family Medicine

## 2015-04-17 MED ORDER — TRAMADOL HCL 50 MG PO TABS: ORAL_TABLET | ORAL | Status: DC

## 2015-04-17 NOTE — Telephone Encounter (Signed)
He will need an appointment if he needs more pain meds

## 2015-04-17 NOTE — Telephone Encounter (Signed)
Called in tramadol .  

## 2015-04-17 NOTE — Telephone Encounter (Signed)
Okay to renew

## 2015-04-17 NOTE — Telephone Encounter (Signed)
Pt called requesting a refill on his tramdol pt uses [facility] - [place], [place] - [address] And pt can be reached at [phone] (M

## 2015-04-17 NOTE — Telephone Encounter (Signed)
He is still waiting for ins. So can have surgery he runs out tomorrow and said he cant come in till next week please advise

## 2015-05-07 ENCOUNTER — Telehealth: Payer: Self-pay | Admitting: Family Medicine

## 2015-05-07 NOTE — Telephone Encounter (Signed)
OK to renew 

## 2015-05-07 NOTE — Telephone Encounter (Signed)
Pt called requesting a refill on his tramadol pt does have a cpe on may 10th, pt uses [facility] - [place], [place] - [address] and pt can be reached at [phone] (M)

## 2015-05-08 ENCOUNTER — Other Ambulatory Visit: Payer: Self-pay

## 2015-05-08 MED ORDER — TRAMADOL HCL 50 MG PO TABS: ORAL_TABLET | ORAL | Status: DC

## 2015-05-08 NOTE — Telephone Encounter (Signed)
Called in per jcl 

## 2015-05-08 NOTE — Telephone Encounter (Signed)
Called in tramadol per jcl 

## 2015-05-09 ENCOUNTER — Other Ambulatory Visit: Payer: Self-pay | Admitting: Internal Medicine

## 2015-05-16 ENCOUNTER — Ambulatory Visit (INDEPENDENT_AMBULATORY_CARE_PROVIDER_SITE_OTHER): Payer: BLUE CROSS/BLUE SHIELD | Admitting: Family Medicine

## 2015-05-16 ENCOUNTER — Encounter: Payer: Self-pay | Admitting: Family Medicine

## 2015-05-16 VITALS — BP 130/88 | HR 80 | Ht 74.0 in | Wt 213.6 lb

## 2015-05-16 DIAGNOSIS — G8929 Other chronic pain: Secondary | ICD-10-CM

## 2015-05-16 DIAGNOSIS — Z0000 Encounter for general adult medical examination without abnormal findings: Secondary | ICD-10-CM

## 2015-05-16 DIAGNOSIS — F1020 Alcohol dependence, uncomplicated: Secondary | ICD-10-CM | POA: Diagnosis not present

## 2015-05-16 DIAGNOSIS — B009 Herpesviral infection, unspecified: Secondary | ICD-10-CM | POA: Diagnosis not present

## 2015-05-16 DIAGNOSIS — Z209 Contact with and (suspected) exposure to unspecified communicable disease: Secondary | ICD-10-CM

## 2015-05-16 DIAGNOSIS — E291 Testicular hypofunction: Secondary | ICD-10-CM | POA: Diagnosis not present

## 2015-05-16 DIAGNOSIS — M549 Dorsalgia, unspecified: Secondary | ICD-10-CM

## 2015-05-16 DIAGNOSIS — K219 Gastro-esophageal reflux disease without esophagitis: Secondary | ICD-10-CM

## 2015-05-16 DIAGNOSIS — F1021 Alcohol dependence, in remission: Secondary | ICD-10-CM

## 2015-05-16 DIAGNOSIS — J4520 Mild intermittent asthma, uncomplicated: Secondary | ICD-10-CM

## 2015-05-16 DIAGNOSIS — J3089 Other allergic rhinitis: Secondary | ICD-10-CM

## 2015-05-16 DIAGNOSIS — Z1159 Encounter for screening for other viral diseases: Secondary | ICD-10-CM | POA: Diagnosis not present

## 2015-05-16 DIAGNOSIS — R5382 Chronic fatigue, unspecified: Secondary | ICD-10-CM

## 2015-05-16 NOTE — Progress Notes (Signed)
Subjective:    Patient ID: Jeff Hoffman, male    DOB: 04-27-1970, 45 y.o.   MRN: [id number]  HPI  he is here for a complete examination. His main complaint today is difficulty with his low back pain. He this trying to get a procedure done and apparently insurance is giving him difficulty. He also complains of a few episodes of fluttering sensation in his chest area but he states it only lasts for a few seconds and goes way before he can actually check his pulse. He was having 4 or 5 per day but none in the last several days. He does have a previous history of low testosterone.  He does complain of a long history of difficulty with fatigue.He also has allergies and asthma. He continues on medications listed in the chart and is doing well with this. He does have a history of herpes but has not had an outbreak recently. He is sexually active and does not use condoms.  He now has a new tattoo on his penis.He is now 14 years alcohol and cigarette free. And social history as well as health maintenance and immunizations were reviewed   Review of Systems  All other systems reviewed and are negative.      Objective:   Physical Exam BP 130/88 mmHg  Pulse 80  Ht  (1.88 m)  Wt 213 lb 9.6 oz (96.888 kg)  BMI 27.41 kg/m2  SpO2 97%  General Appearance:    Alert, cooperative, no distress, appears stated age  Head:    Normocephalic, without obvious abnormality, atraumatic  Eyes:    PERRL, conjunctiva/corneas clear, EOM's intact, fundi    benign  Ears:    Normal TM's and external ear canals  Nose:   Nares normal, mucosa normal, no drainage or sinus   tenderness  Throat:   Lips, mucosa, and tongue normal; teeth and gums normal  Neck:   Supple, no lymphadenopathy;  thyroid:  no   enlargement/tenderness/nodules; no carotid   bruit or JVD  Back:    Spine nontender, no curvature, ROM normal, no CVA     tenderness  Lungs:     Clear to auscultation bilaterally without wheezes, rales or     ronchi;  respirations unlabored  Chest Wall:    No tenderness or deformity   Heart:    Regular rate and rhythm, S1 and S2 normal, no murmur, rub   or gallop     Abdomen:     Soft, non-tender, nondistended, normoactive bowel sounds,    no masses, no hepatosplenomegaly  Genitalia:    Normal male external genitalia without lesions.  Testicles without masses.  No inguinal hernias. Tattoonoted on shaft and head of penis.  Rectal:     deferred  Extremities:   No clubbing, cyanosis or edema  Pulses:   2+ and symmetric all extremities  Skin:   Skin color, texture, turgor normal, no rashes or lesions  Lymph nodes:   Cervical, supraclavicular, and axillary nodes normal  Neurologic:   CNII-XII intact, normal strength, sensation and gait; reflexes 2+ and symmetric throughout          Psych:   Normal mood, affect, hygiene and grooming.          Assessment & Plan:  Routine general medical examination at a health care facility - Plan: CBC with Differential/Platelet, Comprehensive metabolic panel, Lipid panel  Hypogonadism male - Plan: Testosterone  Asthma, chronic, mild intermittent, uncomplicated  Gastroesophageal reflux disease without esophagitis  Other allergic rhinitis  Herpes  Chronic fatigue - Plan: Testosterone, TSH  Contact with or exposure to communicable disease - Plan: HIV antibody, RPR, GC/chlamydia probe amp, urine  Need for hepatitis C screening test - Plan: Hepatitis C antibody  Recovering alcoholic (HCC)  discussed the fluttering sensation in his chest and at this time watchful waiting will occur. Routine blood screening including STI testing will be done. He will continue on his allergy and asthma medications. I will further evaluate his fatigue and follow-up with that. I again discussed the need for him to use condoms.

## 2015-05-17 LAB — CBC WITH DIFFERENTIAL/PLATELET
BASOS PCT: 0 %
Basophils Absolute: 0 cells/uL (ref 0–200)
EOS PCT: 1 %
Eosinophils Absolute: 54 cells/uL (ref 15–500)
HCT: 40.2 % (ref 38.5–50.0)
Hemoglobin: 13.4 g/dL (ref 13.2–17.1)
LYMPHS PCT: 27 %
Lymphs Abs: 1458 cells/uL (ref 850–3900)
MCH: 28.9 pg (ref 27.0–33.0)
MCHC: 33.3 g/dL (ref 32.0–36.0)
MCV: 86.6 fL (ref 80.0–100.0)
MONOS PCT: 9 %
MPV: 9.6 fL (ref 7.5–12.5)
Monocytes Absolute: 486 cells/uL (ref 200–950)
NEUTROS ABS: 3402 {cells}/uL (ref 1500–7800)
Neutrophils Relative %: 63 %
PLATELETS: 160 10*3/uL (ref 140–400)
RBC: 4.64 MIL/uL (ref 4.20–5.80)
RDW: 13.6 % (ref 11.0–15.0)
WBC: 5.4 10*3/uL (ref 4.0–10.5)

## 2015-05-17 LAB — COMPREHENSIVE METABOLIC PANEL
ALT: 12 U/L (ref 9–46)
AST: 15 U/L (ref 10–40)
Albumin: 4.5 g/dL (ref 3.6–5.1)
Alkaline Phosphatase: 72 U/L (ref 40–115)
BUN: 29 mg/dL — ABNORMAL HIGH (ref 7–25)
CHLORIDE: 102 mmol/L (ref 98–110)
CO2: 27 mmol/L (ref 20–31)
CREATININE: 0.9 mg/dL (ref 0.60–1.35)
Calcium: 9.1 mg/dL (ref 8.6–10.3)
GLUCOSE: 89 mg/dL (ref 65–99)
Potassium: 4.2 mmol/L (ref 3.5–5.3)
SODIUM: 139 mmol/L (ref 135–146)
TOTAL PROTEIN: 6.7 g/dL (ref 6.1–8.1)
Total Bilirubin: 0.5 mg/dL (ref 0.2–1.2)

## 2015-05-17 LAB — LIPID PANEL
CHOL/HDL RATIO: 3.8 ratio (ref <=5.0)
Cholesterol: 151 mg/dL (ref 125–200)
HDL: 40 mg/dL (ref >=40)
LDL CALC: 90 mg/dL (ref <130)
Triglycerides: 104 mg/dL (ref <150)
VLDL: 21 mg/dL (ref <30)

## 2015-05-17 LAB — TSH: TSH: 0.85 m[IU]/L (ref 0.40–4.50)

## 2015-05-17 LAB — RPR

## 2015-05-17 LAB — HEPATITIS C ANTIBODY: HCV Ab: NEGATIVE

## 2015-05-17 LAB — HIV ANTIBODY (ROUTINE TESTING W REFLEX): HIV: NONREACTIVE

## 2015-05-17 LAB — TESTOSTERONE: Testosterone: 382 ng/dL (ref 250–827)

## 2015-05-21 ENCOUNTER — Encounter: Payer: Self-pay | Admitting: Medical

## 2015-05-21 ENCOUNTER — Ambulatory Visit (INDEPENDENT_AMBULATORY_CARE_PROVIDER_SITE_OTHER): Payer: BLUE CROSS/BLUE SHIELD | Admitting: Medical

## 2015-05-21 VITALS — BP 112/78 | HR 68 | Temp 97.3°F | Resp 16 | Wt 213.0 lb

## 2015-05-21 DIAGNOSIS — J988 Other specified respiratory disorders: Secondary | ICD-10-CM | POA: Diagnosis not present

## 2015-05-21 DIAGNOSIS — H109 Unspecified conjunctivitis: Secondary | ICD-10-CM | POA: Diagnosis not present

## 2015-05-21 MED ORDER — POLYMYXIN B-TRIMETHOPRIM 10000-0.1 UNIT/ML-% OP SOLN: 1.0000 [drp] | Freq: Four times a day (QID) | OPHTHALMIC | Status: DC

## 2015-05-21 MED ORDER — AMOXICILLIN 875 MG PO TABS: 875.0000 mg | ORAL_TABLET | Freq: Two times a day (BID) | ORAL | Status: DC

## 2015-05-21 NOTE — Progress Notes (Signed)
Subjective: Chief Complaint  Patient presents with  . URI    has been taking OTC dayquil/nyquil and mucinex, vitamin c, zinc losenges, and sudafed. Sunday morning his lt eye was "goopy" and now it is the right eye as well. is using ploymyxin eye drops from when this happened in 2015.    Here for what started as a head cold, that is moving into his chest. Gets bronchitis.   Now has some eye symptoms.   Eyes have been goupy, left eye sealed shut this morning, but not itchy or watery.  More goupy and yucky than anything.  No contacts with pink eye.   Has cough, runny nose, sore throat, fatigue, headache, rattle in the chest.   Using some guaifenesin.  Was exposed to some sick folks this past week.  Started Polytrim he had left over from previous pink eye that is still in date. Started this yesterday 1 drop BID.  No other aggravating or relieving factors. No other complaint.  Past Medical History  Diagnosis Date  . Allergy   . Asthma   . Hypogonadism male   . GERD (gastroesophageal reflux disease)    ROS as in subjective   Objective: BP 112/78 mmHg  Pulse 68  Temp(Src) 97.3 F (36.3 C) (Tympanic)  Resp 16  Wt 213 lb (96.616 kg)  General appearance: Alert, WD/WN, no distress                             Skin: warm, no rash                           Head: no sinus tenderness,                            Eyes: bilat conjunctiva injected, watery and some purulent discharge, left upper eyelid slightly swollen and mild erythema, corneas clear, PERRLA                            Ears: pearly TMs, external ear canals normal                          Nose: septum midline, turbinates swollen, with erythema and mucoid discharge             Mouth/throat: MMM, tongue normal, mild pharyngeal erythema                           Neck: supple, no adenopathy, no thyromegaly, non tender                          Heart: RRR, normal S1, S2, no murmurs                         Lungs: CTA bilaterally, no wheezes,  rales, or rhonchi       Assessment: Encounter Diagnoses  Name Primary?  . Bilateral conjunctivitis Yes  . Respiratory tract infection      Plan: Respiratory tract infection - discussed supportive care, rest, hydration and if not much improved with this or the conjunctivitis by mid week, then begin amoxicillin  Conjunctivitis - Medications prescribed today: Polytrim.  Discussed diagnosis of conjunctivitis/pink eye.  Advised that  pink eye is very contagious and spreads by direct contact.  Discussed treatment including moist compresses, avoid rubbing eyes, do not wear contact lenses or makeup until infection is resolved.  Discussed prevention, hand washing, not rubbing eyes.    Patient was advised to call or return if worse or not improving in the next few days.    Patient voiced understanding of diagnosis, recommendations, and treatment plan.

## 2015-06-06 ENCOUNTER — Telehealth: Payer: Self-pay | Admitting: Internal Medicine

## 2015-06-06 ENCOUNTER — Other Ambulatory Visit: Payer: Self-pay

## 2015-06-06 MED ORDER — TRAMADOL HCL 50 MG PO TABS: ORAL_TABLET | ORAL | Status: DC

## 2015-06-06 NOTE — Telephone Encounter (Signed)
Okay 

## 2015-06-06 NOTE — Telephone Encounter (Signed)
Called tramadol in per jcl 

## 2015-06-06 NOTE — Telephone Encounter (Signed)
Pt would like a refill on her tramadol to [facility]

## 2015-06-13 ENCOUNTER — Ambulatory Visit (INDEPENDENT_AMBULATORY_CARE_PROVIDER_SITE_OTHER): Payer: BLUE CROSS/BLUE SHIELD | Admitting: Family Medicine

## 2015-06-13 ENCOUNTER — Encounter: Payer: Self-pay | Admitting: Family Medicine

## 2015-06-13 VITALS — BP 130/80 | HR 68 | Temp 97.3°F | Ht 74.0 in | Wt 210.8 lb

## 2015-06-13 DIAGNOSIS — J029 Acute pharyngitis, unspecified: Secondary | ICD-10-CM

## 2015-06-13 LAB — POCT RAPID STREP A (OFFICE): RAPID STREP A SCREEN: NEGATIVE

## 2015-06-13 NOTE — Progress Notes (Signed)
Chief Complaint  Patient presents with  . Sore Throat    started last night, mouth also feels slimy. Has a sore on the roof of his mouth. Mucus is clear, but feels like he has some nasal congestion.     Had unprotected oral sex with male partner 4 days ago. Last night he started with sore throat. Has a chronic runny nose, unchanged.   Mouth felt slimy when sleeping last night.  Salt water gargles helped. Denies significant cough. He reports having a sore area on his right upper posterior palate that he could feel with his tongue  +h/o STD, treated 09/2013 Currently on prep with Truvada.  Past Medical History  Diagnosis Date  . Allergy   . Asthma   . Hypogonadism male   . GERD (gastroesophageal reflux disease)    Past Surgical History  Procedure Laterality Date  . Laparoscopic appendectomy N/A 05/05/2014    Procedure: APPENDECTOMY LAPAROSCOPIC;  Surgeon: Avel Peaceodd Rosenbower, MD;  Location: [facility];  Service: General;  Laterality: N/A;  . Arthroscopic knee Right 2010  . Hernia repair  1990's    inguinal hernia  . Back surgery  2004    L4, L5 partial laminectomy and discetomy  . Foot surgery Left 2006   Social History   Social History  . Marital Status: Single    Spouse Name: N/A  . Number of Children: N/A  . Years of Education: N/A   Occupational History  . Not on file.   Social History Main Topics  . Smoking status: Former Smoker    Quit date: 01/06/2001  . Smokeless tobacco: Never Used  . Alcohol Use: No  . Drug Use: No  . Sexual Activity:    Partners: Male   Other Topics Concern  . Not on file   Social History Narrative   Outpatient Encounter Prescriptions as of 06/13/2015  Medication Sig Note  . azelastine (ASTELIN) 0.1 % nasal spray USE 1 SPRAY IN EACH NOSTRIL TWICE A DAY.   . cetirizine (ZYRTEC) 10 MG tablet Take 1 tablet (10 mg total) by mouth daily.   . fluticasone (FLONASE) 50 MCG/ACT nasal spray Place 2 sprays into both nostrils daily. (Patient taking  differently: Place 2 sprays into both nostrils 2 (two) times daily. )   . hydrocortisone cream 0.5 % Apply 1 application topically daily as needed for itching.   Marland Kitchen. L-Arginine 1000 MG TABS Take 2 tablets by mouth daily.   . Magnesium 200 MG TABS Take 1 tablet by mouth daily.     . montelukast (SINGULAIR) 10 MG tablet TAKE 1 TABLET ONCE DAILY.   . Naproxen Sodium (ALEVE PO) Take 2 tablets by mouth 2 (two) times daily.    . Omega-3 Fatty Acids (FISH OIL) 1000 MG CAPS Take 1 tablet by mouth daily.   Marland Kitchen. omeprazole (PRILOSEC) 40 MG capsule TAKE 1 CAPSULE DAILY.   Marland Kitchen. polycarbophil (FIBERCON) 625 MG tablet Take 625 mg by mouth daily.   . pseudoephedrine (SUDAFED) 60 MG tablet Take 60 mg by mouth every 4 (four) hours as needed for congestion.   Marland Kitchen. QVAR 80 MCG/ACT inhaler USE 1 PUFF TWICE A DAY. RINSE MOUTH.   . traMADol (ULTRAM) 50 MG tablet TAKE (1) TABLET EVERY EIGHT HOURS AS NEEDED FOR PAIN.   . TRUVADA 200-300 MG tablet TAKE 1 TABLET DAILY.   Marland Kitchen. acyclovir (ZOVIRAX) 400 MG tablet Take 400 mg by mouth 2 (two) times daily. Reported on 06/13/2015 06/13/2015: Uses prn; no recent flares  . Albuterol  Sulfate (PROAIR RESPICLICK) 108 (90 BASE) MCG/ACT AEPB Inhale 2 puffs into the lungs 4 (four) times daily as needed. (Patient not taking: Reported on 06/13/2015)   . ibuprofen (ADVIL,MOTRIN) 200 MG tablet Take 200 mg by mouth every 6 (six) hours as needed. Reported on 06/13/2015   . [DISCONTINUED] amoxicillin (AMOXIL) 875 MG tablet Take 1 tablet (875 mg total) by mouth 2 (two) times daily.   . [DISCONTINUED] bismuth subsalicylate (PEPTO BISMOL) 262 MG/15ML suspension Take 30 mLs by mouth every 6 (six) hours as needed for indigestion.   . [DISCONTINUED] ferrous sulfate 325 (65 FE) MG tablet Take 325 mg by mouth daily with breakfast.   . [DISCONTINUED] trimethoprim-polymyxin b (POLYTRIM) ophthalmic solution Place 1 drop into both eyes every 6 (six) hours.    No facility-administered encounter medications on file as of  06/13/2015.   Allergies  Allergen Reactions  . Other Rash    Pt had clear tape in operating room that caused bad blisters. Was changed to paper tape and cloth tape and they worked well  . Erythromycin     Sharp stomach pains   ROS: No fever, chills, some intermittent "hot flashes" Denies nausea, vomiting, abdominal pain, diarrhea, urinary complaints, bleeding, bruising, rashes Denies chest pain, palpitations (unchanged--intermittent/occasional), shortness of breath.  PHYSICAL EXAM: BP 130/80 mmHg  Pulse 68  Temp(Src) 97.3 F (36.3 C) (Tympanic)  Ht  (1.88 m)  Wt 210 lb 12.8 oz (95.618 kg)  BMI 27.05 kg/m2  Well developed, pleasant male, wearing a kilt, in no distress HEENT: PERRL, EOMI, conjunctiva and sclera clear.  TM's and EAC's normal. OP:  Nothing abnormal noted in area of upper right posterior palate (area of concern). Tonsils are normal. No erythema, exudate.  No ulcers or any other lesions noted in the mouth. Nasal mucosa is without erythema or purulence. Sinuses nontender. Neck: No lymphadenopathy Heart: regular rate and rhythm Lungs: clear bilaterally  Rapid strep negative   ASSESSMENT/PLAN:  Sore throat - Plan: Rapid Strep A   Reassured no evidence of STD or strep based on today's exam. Return for re-evaluation if symptoms persist, worsen. Encouraged safe sex Suspect that he may develop URI, with sore throat being earliest symptom.  Supportive measures if other URI symptoms develop.

## 2015-06-21 ENCOUNTER — Other Ambulatory Visit: Payer: Self-pay | Admitting: Family Medicine

## 2015-06-21 NOTE — Telephone Encounter (Signed)
Is this okay to refill? 

## 2015-06-26 ENCOUNTER — Other Ambulatory Visit: Payer: Self-pay

## 2015-06-26 ENCOUNTER — Other Ambulatory Visit: Payer: Self-pay | Admitting: Family Medicine

## 2015-06-26 ENCOUNTER — Telehealth: Payer: Self-pay | Admitting: Family Medicine

## 2015-06-26 MED ORDER — TRAMADOL HCL 50 MG PO TABS: ORAL_TABLET | ORAL | Status: DC

## 2015-06-26 NOTE — Telephone Encounter (Signed)
Pt called and stated that [place] [facility] today and realised he does not have enough Tramadol for length of trip. Pt is requesting a ref[facility] be [facility]AP to [facility].

## 2015-06-26 NOTE — Telephone Encounter (Signed)
Ok to renew?  

## 2015-06-26 NOTE — Telephone Encounter (Signed)
Called in.

## 2015-07-04 ENCOUNTER — Encounter: Payer: Self-pay | Admitting: Family Medicine

## 2015-07-04 ENCOUNTER — Ambulatory Visit (INDEPENDENT_AMBULATORY_CARE_PROVIDER_SITE_OTHER): Payer: BLUE CROSS/BLUE SHIELD | Admitting: Family Medicine

## 2015-07-04 VITALS — BP 122/76 | HR 80 | Ht 74.0 in | Wt 216.2 lb

## 2015-07-04 DIAGNOSIS — Z1159 Encounter for screening for other viral diseases: Secondary | ICD-10-CM | POA: Diagnosis not present

## 2015-07-04 DIAGNOSIS — Z7251 High risk heterosexual behavior: Secondary | ICD-10-CM | POA: Diagnosis not present

## 2015-07-04 DIAGNOSIS — N529 Male erectile dysfunction, unspecified: Secondary | ICD-10-CM

## 2015-07-04 DIAGNOSIS — Z209 Contact with and (suspected) exposure to unspecified communicable disease: Secondary | ICD-10-CM

## 2015-07-04 MED ORDER — TADALAFIL 5 MG PO TABS: 5.0000 mg | ORAL_TABLET | Freq: Every day | ORAL | Status: DC | PRN

## 2015-07-04 NOTE — Progress Notes (Signed)
   Subjective:    Patient ID: Jeff Hoffman, male    DOB: 01/30/1970, 45 y.o.   MRN: [id number]  HPI He is here for consult. The last weekend he did have several sexual encounters involving the risky sexual behavior, not using condoms in both active and passive position. His had no discharge, fever, chills, skin changes. He also notes difficulty with getting and maintaining erections and would like medication to help with this. Review of records indicates he has not been tested for hepatitis B.   Review of Systems     Objective:   Physical Exam  Alert and in no distress otherwise not examined       Assessment & Plan:  Contact with or exposure to communicable disease - Plan: RPR, HIV antibody, GC/chlamydia probe amp, urine  Erectile dysfunction, unspecified erectile dysfunction type - Plan: tadalafil (CIALIS) 5 MG tablet  Need for hepatitis B screening test  Risky sexual behavior Again stressed the need for him to be much more careful with his sexual behavior and use condoms. He again refuses. A prescription for Cialis was written with instructions on proper use and possible side effects. Hepatitis B screening will be done.

## 2015-07-05 LAB — GC/CHLAMYDIA PROBE AMP
CT Probe RNA: NOT DETECTED
GC Probe RNA: NOT DETECTED

## 2015-07-05 LAB — RPR

## 2015-07-05 LAB — HIV ANTIBODY (ROUTINE TESTING W REFLEX): HIV 1&2 Ab, 4th Generation: NONREACTIVE

## 2015-07-05 LAB — HEPATITIS C ANTIBODY: HCV AB: NEGATIVE

## 2015-07-06 ENCOUNTER — Telehealth: Payer: Self-pay | Admitting: Family Medicine

## 2015-07-06 DIAGNOSIS — N529 Male erectile dysfunction, unspecified: Secondary | ICD-10-CM

## 2015-07-06 MED ORDER — TADALAFIL 2.5 MG PO TABS: 1.0000 | ORAL_TABLET | Freq: Every day | ORAL | Status: DC

## 2015-07-06 NOTE — Telephone Encounter (Signed)
Pt states insurance is expiring today and needs P.A. Cialis, states has tried Viagra

## 2015-07-06 NOTE — Telephone Encounter (Signed)
Pt notified that rx called [facility] and approved for Cialis 2.5 mg. Trixie Rude/RLB

## 2015-07-06 NOTE — Telephone Encounter (Signed)
Called [employer] for PA on Cialis 5mg - Case # [id number]- [id number]. Pt must try Cialis 2.5mg  once daily first for dx of E.D. Covered options per Drinda Buttsnnette is Cialis 5 or 20mg  for 8 units every 30 days, or Cialis 2.5 mg once daily. Per Vincenza HewsShane- okay to change Cialis 5mg  to Cialis 2.5 mg once daily # 30 with 0 refills.

## 2015-07-13 ENCOUNTER — Encounter: Payer: Self-pay | Admitting: Family Medicine

## 2015-07-16 ENCOUNTER — Encounter: Payer: Self-pay | Admitting: Family Medicine

## 2015-07-16 ENCOUNTER — Ambulatory Visit (INDEPENDENT_AMBULATORY_CARE_PROVIDER_SITE_OTHER): Payer: BLUE CROSS/BLUE SHIELD | Admitting: Family Medicine

## 2015-07-16 ENCOUNTER — Other Ambulatory Visit: Payer: Self-pay

## 2015-07-16 VITALS — BP 120/70 | Wt 211.4 lb

## 2015-07-16 DIAGNOSIS — J3089 Other allergic rhinitis: Secondary | ICD-10-CM | POA: Diagnosis not present

## 2015-07-16 DIAGNOSIS — A546 Gonococcal infection of anus and rectum: Secondary | ICD-10-CM

## 2015-07-16 DIAGNOSIS — G8929 Other chronic pain: Secondary | ICD-10-CM

## 2015-07-16 DIAGNOSIS — Z7251 High risk heterosexual behavior: Secondary | ICD-10-CM | POA: Diagnosis not present

## 2015-07-16 DIAGNOSIS — M549 Dorsalgia, unspecified: Secondary | ICD-10-CM | POA: Diagnosis not present

## 2015-07-16 MED ORDER — TRAMADOL HCL 50 MG PO TABS: ORAL_TABLET | ORAL | Status: DC

## 2015-07-16 NOTE — Patient Instructions (Signed)
Switch to Rhinocort nasal spray and then try either Claritin or Allegra

## 2015-07-16 NOTE — Progress Notes (Signed)
   Subjective:    Patient ID: Jeff Hoffman, male    DOB: 09/30/1970, 45 y.o.   MRN: [id number]  HPI He is here for consultation concerning multiple complaints. He notes difficulty with gas and mucousy bowel movements. In the past see notes that this was associated with rectal gonorrhea. He does not take adequate precautions with condom prior to sexual activity. Sexual activity prior to the symptoms was roughly 10 days. Had recent STD testing. He continues to have back pain. He recently switched insurance and will be trying to get coverage to have his back surgery. This has been long overdue and he has had difficulty getting the insurance to cover it. He does have underlying allergies and presently is on Flonase, Astelin, Singulair and Zyrtec. His main complaint today is rhinorrhea.   Review of Systems     Objective:   Physical Exam Alert and in no distress. Anal exam shows no lesions. GC/chlamydia culture taken.       Assessment & Plan:  Risky sexual behavior - Plan: GC/Chlamydia Probe Amp, CANCELED: GC/chlamydia probe amp, genital  Other allergic rhinitis  Chronic back pain - Plan: traMADol (ULTRAM) 50 MG tablet Recommend he switch to Rhinocort and either try Claritin or Allegra as well as staying on Astelin and Singulair to see if this helps with his allergy symptoms. Discussed possible referral for allergy testing if this does not work. I will also renew his tramadol. I will await the culture results.

## 2015-07-17 ENCOUNTER — Other Ambulatory Visit (INDEPENDENT_AMBULATORY_CARE_PROVIDER_SITE_OTHER): Payer: BLUE CROSS/BLUE SHIELD

## 2015-07-17 DIAGNOSIS — A64 Unspecified sexually transmitted disease: Secondary | ICD-10-CM | POA: Diagnosis not present

## 2015-07-17 LAB — GC/CHLAMYDIA PROBE AMP
CT PROBE, AMP APTIMA: NOT DETECTED
GC Probe RNA: DETECTED — AB

## 2015-07-17 MED ORDER — CEFTRIAXONE SODIUM 250 MG IJ SOLR
250.0000 mg | Freq: Once | INTRAMUSCULAR | Status: AC
  Administered 2015-07-17: 250 mg via INTRAMUSCULAR

## 2015-07-17 MED ORDER — CEFTRIAXONE SODIUM 1 G IJ SOLR: 250.0000 mg | INTRAMUSCULAR | Status: DC

## 2015-07-17 NOTE — Addendum Note (Signed)
Addended by: Ronnald NianLALONDE, Kairen Hallinan C on: 07/17/2015 11:45 AM   Modules accepted: Orders

## 2015-07-30 ENCOUNTER — Other Ambulatory Visit: Payer: Self-pay | Admitting: Medical

## 2015-07-30 ENCOUNTER — Other Ambulatory Visit: Payer: Self-pay | Admitting: Family Medicine

## 2015-07-30 ENCOUNTER — Encounter: Payer: Self-pay | Admitting: Family Medicine

## 2015-07-30 DIAGNOSIS — N529 Male erectile dysfunction, unspecified: Secondary | ICD-10-CM

## 2015-07-30 MED ORDER — TADALAFIL 5 MG PO TABS
5.0000 mg | ORAL_TABLET | Freq: Every day | ORAL | 11 refills | Status: DC | PRN
Start: 2015-07-30 — End: 2016-08-15

## 2015-07-30 MED ORDER — TADALAFIL 2.5 MG PO TABS: 1.0000 | ORAL_TABLET | Freq: Every day | ORAL | 11 refills | Status: DC

## 2015-07-31 NOTE — Telephone Encounter (Signed)
pls refill the Tadalafil 2.5mg  #30, once daily, no refill.  Make sure he and pharmacy note to route future refill to Dr. Susann Givens as he is his PCP

## 2015-08-05 ENCOUNTER — Encounter: Payer: Self-pay | Admitting: Family Medicine

## 2015-08-06 ENCOUNTER — Other Ambulatory Visit: Payer: BLUE CROSS/BLUE SHIELD

## 2015-08-06 ENCOUNTER — Other Ambulatory Visit: Payer: Self-pay | Admitting: Family Medicine

## 2015-08-06 MED ORDER — DOXYCYCLINE HYCLATE 100 MG PO TABS: 100.0000 mg | ORAL_TABLET | Freq: Two times a day (BID) | ORAL | 0 refills | Status: DC

## 2015-08-08 ENCOUNTER — Other Ambulatory Visit: Payer: Self-pay | Admitting: Family Medicine

## 2015-08-08 DIAGNOSIS — M549 Dorsalgia, unspecified: Principal | ICD-10-CM

## 2015-08-08 DIAGNOSIS — G8929 Other chronic pain: Secondary | ICD-10-CM

## 2015-08-09 ENCOUNTER — Other Ambulatory Visit: Payer: Self-pay

## 2015-08-09 DIAGNOSIS — M549 Dorsalgia, unspecified: Principal | ICD-10-CM

## 2015-08-09 DIAGNOSIS — G8929 Other chronic pain: Secondary | ICD-10-CM

## 2015-08-09 MED ORDER — TRAMADOL HCL 50 MG PO TABS: ORAL_TABLET | ORAL | 0 refills | Status: DC

## 2015-08-14 ENCOUNTER — Other Ambulatory Visit: Payer: Self-pay | Admitting: Internal Medicine

## 2015-08-21 ENCOUNTER — Ambulatory Visit (INDEPENDENT_AMBULATORY_CARE_PROVIDER_SITE_OTHER): Payer: BLUE CROSS/BLUE SHIELD | Admitting: Family Medicine

## 2015-08-21 ENCOUNTER — Encounter: Payer: Self-pay | Admitting: Family Medicine

## 2015-08-21 ENCOUNTER — Encounter: Payer: Self-pay | Admitting: Internal Medicine

## 2015-08-21 ENCOUNTER — Telehealth: Payer: Self-pay | Admitting: Internal Medicine

## 2015-08-21 VITALS — BP 114/70 | HR 67 | Wt 216.0 lb

## 2015-08-21 DIAGNOSIS — M79602 Pain in left arm: Secondary | ICD-10-CM

## 2015-08-21 DIAGNOSIS — R0609 Other forms of dyspnea: Secondary | ICD-10-CM

## 2015-08-21 DIAGNOSIS — F1021 Alcohol dependence, in remission: Secondary | ICD-10-CM

## 2015-08-21 NOTE — Progress Notes (Signed)
   Subjective:    Patient ID: Jeff Hoffman, male    DOB: 06/27/1970, 45 y.o.   MRN: [id number]  HPI He complains of a two-month history of difficulty with dyspnea on exertion. He states that prior to this he would have no trouble with increased physical activity being able to walk up at least 3 flights of steps without difficulty. The last 2 months with one flight of steps he does gets short of breath. No history of chest pain, PND or diaphoresis. He also notes that when he does his weight training his heart rate will increase to 170 and make him slightly short of breath which again is unusual. Family history significant for father having an MI and dying at age 47. He quit smoking approximately 14 years ago. He also complains of left arm tingling sensation in the fourth and fifth fingers but cannot relate this to wrist, elbow, shoulder or neck discomfort. It tends to come and go and not cause difficulty with strength. He also mentions continued difficulty dealing with pain and waiting to have surgery done. He continues on pain medications and this is starting to weigh heavily on him. He has been alcohol free for about 14 years. Apparently his sponsor thinks that he could be depressed.   Review of Systems     Objective:   Physical Exam Alert and in no distress with appropriate affect Cardiac exam shows regular rhythm without murmurs or gallops. Lungs are clear to auscultation. Full motion of the neck and shoulder without pain. Normal motor sensory and DTRs of the arm. EKG shows no acute changes      Assessment & Plan:  DOE (dyspnea on exertion) - Plan: EKG 12-Lead, ECHOCARDIOGRAM COMPLETE  Left arm pain  Recovering alcoholic in remission (HCC) I explained that we will need to workup the dyspnea on exertion and possibly refer to cardiology. Discussed the arm pain and explained that this did not seem to be related to the shortness of breath. He seemed reassured with this. At this time we will  do watchful waiting. Concerning his possible depression, I will refer for further counseling before any potential medications will be given. He is comfortable with this.

## 2015-08-21 NOTE — Telephone Encounter (Signed)
No prior auth needed for ECHO per insurance company

## 2015-08-29 ENCOUNTER — Other Ambulatory Visit: Payer: Self-pay | Admitting: Neurosurgery

## 2015-08-30 ENCOUNTER — Other Ambulatory Visit: Payer: Self-pay

## 2015-08-30 ENCOUNTER — Ambulatory Visit (HOSPITAL_COMMUNITY): Payer: BLUE CROSS/BLUE SHIELD | Attending: Family Medicine

## 2015-08-30 ENCOUNTER — Other Ambulatory Visit (HOSPITAL_COMMUNITY): Payer: BLUE CROSS/BLUE SHIELD

## 2015-08-30 DIAGNOSIS — R0609 Other forms of dyspnea: Secondary | ICD-10-CM | POA: Insufficient documentation

## 2015-08-30 DIAGNOSIS — Z8249 Family history of ischemic heart disease and other diseases of the circulatory system: Secondary | ICD-10-CM | POA: Diagnosis not present

## 2015-08-30 DIAGNOSIS — I5189 Other ill-defined heart diseases: Secondary | ICD-10-CM | POA: Insufficient documentation

## 2015-08-30 DIAGNOSIS — R0600 Dyspnea, unspecified: Secondary | ICD-10-CM | POA: Diagnosis present

## 2015-09-05 ENCOUNTER — Other Ambulatory Visit: Payer: Self-pay | Admitting: Family Medicine

## 2015-09-05 DIAGNOSIS — M549 Dorsalgia, unspecified: Principal | ICD-10-CM

## 2015-09-05 DIAGNOSIS — G8929 Other chronic pain: Secondary | ICD-10-CM

## 2015-09-05 MED ORDER — TRAMADOL HCL 50 MG PO TABS: ORAL_TABLET | ORAL | 0 refills | Status: DC

## 2015-09-05 NOTE — Telephone Encounter (Signed)
Called in tramadol per Dr. Susann GivensLalonde

## 2015-09-09 ENCOUNTER — Other Ambulatory Visit: Payer: Self-pay | Admitting: Family Medicine

## 2015-09-09 DIAGNOSIS — M549 Dorsalgia, unspecified: Principal | ICD-10-CM

## 2015-09-09 DIAGNOSIS — G8929 Other chronic pain: Secondary | ICD-10-CM

## 2015-09-11 MED ORDER — TRAMADOL HCL 50 MG PO TABS: ORAL_TABLET | ORAL | 0 refills | Status: DC

## 2015-09-17 ENCOUNTER — Other Ambulatory Visit: Payer: Self-pay | Admitting: Family Medicine

## 2015-09-17 NOTE — Telephone Encounter (Signed)
Is this okay to refill? 

## 2015-10-02 ENCOUNTER — Other Ambulatory Visit: Payer: BLUE CROSS/BLUE SHIELD

## 2015-10-04 ENCOUNTER — Other Ambulatory Visit (INDEPENDENT_AMBULATORY_CARE_PROVIDER_SITE_OTHER): Payer: BLUE CROSS/BLUE SHIELD

## 2015-10-04 DIAGNOSIS — Z23 Encounter for immunization: Secondary | ICD-10-CM | POA: Diagnosis not present

## 2015-10-08 ENCOUNTER — Encounter (HOSPITAL_COMMUNITY): Payer: Self-pay

## 2015-10-08 ENCOUNTER — Encounter (HOSPITAL_COMMUNITY)
Admission: RE | Admit: 2015-10-08 | Discharge: 2015-10-08 | Disposition: A | Discharge status: Home / Self Care | Payer: BLUE CROSS/BLUE SHIELD | Source: Ambulatory Visit | Attending: Neurosurgery | Admitting: Neurosurgery

## 2015-10-08 DIAGNOSIS — M5136 Other intervertebral disc degeneration, lumbar region: Secondary | ICD-10-CM | POA: Insufficient documentation

## 2015-10-08 DIAGNOSIS — Z01812 Encounter for preprocedural laboratory examination: Secondary | ICD-10-CM | POA: Diagnosis not present

## 2015-10-08 LAB — CBC WITH DIFFERENTIAL/PLATELET
BASOS ABS: 0 10*3/uL (ref 0.0–0.1)
Basophils Relative: 1 %
EOS ABS: 0 10*3/uL (ref 0.0–0.7)
Eosinophils Relative: 1 %
HCT: 41.8 % (ref 39.0–52.0)
HEMOGLOBIN: 13.4 g/dL (ref 13.0–17.0)
LYMPHS ABS: 1.4 10*3/uL (ref 0.7–4.0)
LYMPHS PCT: 41 %
MCH: 27.5 pg (ref 26.0–34.0)
MCHC: 32.1 g/dL (ref 30.0–36.0)
MCV: 85.8 fL (ref 78.0–100.0)
Monocytes Absolute: 0.4 10*3/uL (ref 0.1–1.0)
Monocytes Relative: 10 %
NEUTROS PCT: 47 %
Neutro Abs: 1.6 10*3/uL — ABNORMAL LOW (ref 1.7–7.7)
Platelets: 150 10*3/uL (ref 150–400)
RBC: 4.87 MIL/uL (ref 4.22–5.81)
RDW: 12.9 % (ref 11.5–15.5)
WBC: 3.4 10*3/uL — AB (ref 4.0–10.5)

## 2015-10-08 LAB — COMPREHENSIVE METABOLIC PANEL
ALBUMIN: 4.3 g/dL (ref 3.5–5.0)
ALT: 16 U/L — ABNORMAL LOW (ref 17–63)
ANION GAP: 8 (ref 5–15)
AST: 17 U/L (ref 15–41)
Alkaline Phosphatase: 79 U/L (ref 38–126)
BUN: 19 mg/dL (ref 6–20)
CO2: 29 mmol/L (ref 22–32)
Calcium: 9.3 mg/dL (ref 8.9–10.3)
Chloride: 102 mmol/L (ref 101–111)
Creatinine, Ser: 0.95 mg/dL (ref 0.61–1.24)
GFR calc Af Amer: >60 mL/min (ref >60)
GFR calc non Af Amer: >60 mL/min (ref >60)
GLUCOSE: 103 mg/dL — AB (ref 65–99)
POTASSIUM: 4.6 mmol/L (ref 3.5–5.1)
SODIUM: 139 mmol/L (ref 135–145)
TOTAL PROTEIN: 6.7 g/dL (ref 6.5–8.1)
Total Bilirubin: 0.9 mg/dL (ref 0.3–1.2)

## 2015-10-08 LAB — TYPE AND SCREEN
ABO/RH(D): A NEG
Antibody Screen: NEGATIVE

## 2015-10-08 LAB — SURGICAL PCR SCREEN
MRSA, PCR: NEGATIVE
STAPHYLOCOCCUS AUREUS: POSITIVE — AB

## 2015-10-08 LAB — ABO/RH: ABO/RH(D): A NEG

## 2015-10-08 NOTE — Pre-Procedure Instructions (Addendum)
Gladstone PihShannon A Meuser  10/08/2015      [facility] [place] [place] [address] Phone: [phone] Fax: [phone]    Your procedure is scheduled on 10/16/15.  [facility] Admitting at 600 A.M.  Call this number if you have problems the morning of surgery:  [phone]   Remember:  Do not eat food or drink liquids after midnight.  Take these medicines the morning of surgery with A SIP OF WATER inhalers if needed(bring albuterol),acyclovir,truvada, singulair,flonase if needed, omeprazole,tramadol if needed  STOP all herbel meds, nsaids (aleve,naproxen,advil,ibuprofen)  Starting tomorrow(10/09/15) including vit C,aspirin,sudafed,fish oil   Do not wear jewelry, make-up or nail polish.  Do not wear lotions, powders, or perfumes, or deoderant.  Do not shave 48 hours prior to surgery.  Men may shave face and neck.  Do not bring valuables to the hospital.  [facility] is not responsible for any belongings or valuables.  Contacts, dentures or bridgework may not be worn into surgery.  Leave your suitcase in the car.  After surgery it may be brought to your room.  For patients admitted to the hospital, discharge time will be determined by your treatment team.  Patients discharged the day of surgery will not be allowed to drive home.   Name and phone number of your driver:   Special instructions:   Special Instructions: [place] - Preparing for Surgery  Before surgery, you can play an important role.  Because skin is not sterile, your skin needs to be as free of germs as possible.  You can reduce the number of germs on you skin by washing with CHG (chlorahexidine gluconate) soap before surgery.  CHG is an antiseptic cleaner which kills germs and bonds with the skin to continue killing germs even after washing.  Please DO NOT use if you have an allergy to CHG or antibacterial soaps.  If your  skin becomes reddened/irritated stop using the CHG and inform your nurse when you arrive at Short Stay.  Do not shave (including legs and underarms) for at least 48 hours prior to the first CHG shower.  You may shave your face.  Please follow these instructions carefully:   1.  Shower with CHG Soap the night before surgery and the morning of Surgery.  2.  If you choose to wash your hair, wash your hair first as usual with your normal shampoo.  3.  After you shampoo, rinse your hair and body thoroughly to remove the Shampoo.  4.  Use CHG as you would any other liquid soap.  You can apply chg directly  to the skin and wash gently with scrungie or a clean washcloth.  5.  Apply the CHG Soap to your body ONLY FROM THE NECK DOWN.  Do not use on open wounds or open sores.  Avoid contact with your eyes ears, mouth and genitals (private parts).  Wash genitals (private parts)       with your normal soap.  6.  Wash thoroughly, paying special attention to the area where your surgery will be performed.  7.  Thoroughly rinse your body with warm water from the neck down.  8.  DO NOT shower/wash with your normal soap after using and rinsing off the CHG Soap.  9.  Pat yourself dry with a clean towel.            10.  Wear clean pajamas.  11.  Place clean sheets on your bed the night of your first shower and do not sleep with pets.  Day of Surgery  Do not apply any lotions/deodorants the morning of surgery.  Please wear clean clothes to the hospital/surgery center.  Please read over the  fact sheets that you were given.

## 2015-10-09 ENCOUNTER — Other Ambulatory Visit: Payer: Self-pay | Admitting: Family Medicine

## 2015-10-09 DIAGNOSIS — G8929 Other chronic pain: Secondary | ICD-10-CM

## 2015-10-09 DIAGNOSIS — M549 Dorsalgia, unspecified: Principal | ICD-10-CM

## 2015-10-10 MED ORDER — TRAMADOL HCL 50 MG PO TABS: ORAL_TABLET | ORAL | 0 refills | Status: DC

## 2015-10-10 NOTE — Telephone Encounter (Signed)
Dr.Lalonde he is asking for Tramadol refill is this okay

## 2015-10-12 ENCOUNTER — Other Ambulatory Visit: Payer: Self-pay | Admitting: Family Medicine

## 2015-10-12 NOTE — Telephone Encounter (Signed)
Is this okay to refill? 

## 2015-10-13 ENCOUNTER — Telehealth: Payer: Self-pay | Admitting: Family Medicine

## 2015-10-13 NOTE — Telephone Encounter (Signed)
P.A. TRAMADOL  °

## 2015-10-13 NOTE — Telephone Encounter (Signed)
Needs blood work for HIV

## 2015-10-16 ENCOUNTER — Encounter (HOSPITAL_COMMUNITY)
Admission: RE | Disposition: A | Discharge status: Home Health | Payer: Self-pay | Source: Ambulatory Visit | Attending: Neurosurgery

## 2015-10-16 ENCOUNTER — Inpatient Hospital Stay (HOSPITAL_COMMUNITY): Payer: BLUE CROSS/BLUE SHIELD | Admitting: Certified Registered Nurse Anesthetist

## 2015-10-16 ENCOUNTER — Inpatient Hospital Stay (HOSPITAL_COMMUNITY): Payer: BLUE CROSS/BLUE SHIELD

## 2015-10-16 ENCOUNTER — Encounter (HOSPITAL_COMMUNITY): Payer: Self-pay | Admitting: Urology

## 2015-10-16 ENCOUNTER — Inpatient Hospital Stay (HOSPITAL_COMMUNITY)
Admission: RE | Admit: 2015-10-16 | Discharge: 2015-10-17 | DRG: 455 | Disposition: A | Discharge status: Home Health | Payer: BLUE CROSS/BLUE SHIELD | Source: Ambulatory Visit | Attending: Neurosurgery | Admitting: Neurosurgery

## 2015-10-16 DIAGNOSIS — M48061 Spinal stenosis, lumbar region without neurogenic claudication: Secondary | ICD-10-CM | POA: Diagnosis present

## 2015-10-16 DIAGNOSIS — Z9109 Other allergy status, other than to drugs and biological substances: Secondary | ICD-10-CM | POA: Diagnosis not present

## 2015-10-16 DIAGNOSIS — K219 Gastro-esophageal reflux disease without esophagitis: Secondary | ICD-10-CM | POA: Diagnosis present

## 2015-10-16 DIAGNOSIS — M47817 Spondylosis without myelopathy or radiculopathy, lumbosacral region: Secondary | ICD-10-CM | POA: Diagnosis present

## 2015-10-16 DIAGNOSIS — Z419 Encounter for procedure for purposes other than remedying health state, unspecified: Secondary | ICD-10-CM

## 2015-10-16 DIAGNOSIS — M47816 Spondylosis without myelopathy or radiculopathy, lumbar region: Secondary | ICD-10-CM | POA: Diagnosis present

## 2015-10-16 DIAGNOSIS — Z7951 Long term (current) use of inhaled steroids: Secondary | ICD-10-CM | POA: Diagnosis not present

## 2015-10-16 DIAGNOSIS — Z7982 Long term (current) use of aspirin: Secondary | ICD-10-CM

## 2015-10-16 DIAGNOSIS — M549 Dorsalgia, unspecified: Secondary | ICD-10-CM | POA: Diagnosis present

## 2015-10-16 DIAGNOSIS — M5137 Other intervertebral disc degeneration, lumbosacral region: Principal | ICD-10-CM | POA: Diagnosis present

## 2015-10-16 DIAGNOSIS — Z87891 Personal history of nicotine dependence: Secondary | ICD-10-CM

## 2015-10-16 DIAGNOSIS — Z79899 Other long term (current) drug therapy: Secondary | ICD-10-CM

## 2015-10-16 DIAGNOSIS — Z881 Allergy status to other antibiotic agents status: Secondary | ICD-10-CM

## 2015-10-16 DIAGNOSIS — J45909 Unspecified asthma, uncomplicated: Secondary | ICD-10-CM | POA: Diagnosis present

## 2015-10-16 DIAGNOSIS — E291 Testicular hypofunction: Secondary | ICD-10-CM | POA: Diagnosis present

## 2015-10-16 SURGERY — POSTERIOR LUMBAR FUSION 1 LEVEL
Anesthesia: General | Site: Spine Lumbar

## 2015-10-16 MED ORDER — PROPOFOL 10 MG/ML IV BOLUS
INTRAVENOUS | Status: AC
  Filled 2015-10-16: qty 20

## 2015-10-16 MED ORDER — ONDANSETRON HCL 4 MG/2ML IJ SOLN: 4.0000 mg | INTRAMUSCULAR | Status: DC | PRN

## 2015-10-16 MED ORDER — LIDOCAINE 2% (20 MG/ML) 5 ML SYRINGE
INTRAMUSCULAR | Status: AC
  Filled 2015-10-16: qty 5

## 2015-10-16 MED ORDER — PANTOPRAZOLE SODIUM 40 MG PO TBEC
40.0000 mg | DELAYED_RELEASE_TABLET | Freq: Every day | ORAL | Status: DC
  Filled 2015-10-16: qty 1

## 2015-10-16 MED ORDER — SODIUM CHLORIDE 0.9% FLUSH
3.0000 mL | Freq: Two times a day (BID) | INTRAVENOUS | Status: DC
Start: 2015-10-16 — End: 2015-10-17
  Administered 2015-10-16: 3 mL via INTRAVENOUS

## 2015-10-16 MED ORDER — GLYCOPYRROLATE 0.2 MG/ML IV SOSY
PREFILLED_SYRINGE | INTRAVENOUS | Status: DC | PRN
  Administered 2015-10-16: .6 mg via INTRAVENOUS

## 2015-10-16 MED ORDER — AZELASTINE HCL 0.1 % NA SOLN
1.0000 | Freq: Two times a day (BID) | NASAL | Status: DC
  Administered 2015-10-16: 1 via NASAL
  Filled 2015-10-16: qty 30

## 2015-10-16 MED ORDER — SODIUM CHLORIDE 0.9 % IR SOLN
Status: DC | PRN
  Administered 2015-10-16: 500 mL

## 2015-10-16 MED ORDER — VECURONIUM BROMIDE 10 MG IV SOLR
INTRAVENOUS | Status: AC
  Filled 2015-10-16: qty 20

## 2015-10-16 MED ORDER — MAGNESIUM OXIDE 400 (241.3 MG) MG PO TABS
200.0000 mg | ORAL_TABLET | Freq: Every day | ORAL | Status: DC
  Administered 2015-10-16: 200 mg via ORAL
  Filled 2015-10-16 (×2): qty 0.5

## 2015-10-16 MED ORDER — NEOSTIGMINE METHYLSULFATE 5 MG/5ML IV SOSY
PREFILLED_SYRINGE | INTRAVENOUS | Status: AC
  Filled 2015-10-16: qty 5

## 2015-10-16 MED ORDER — BUPIVACAINE HCL (PF) 0.25 % IJ SOLN
INTRAMUSCULAR | Status: DC | PRN
  Administered 2015-10-16: 20 mL

## 2015-10-16 MED ORDER — LIDOCAINE 2% (20 MG/ML) 5 ML SYRINGE
INTRAMUSCULAR | Status: DC | PRN
  Administered 2015-10-16: 100 mg via INTRAVENOUS

## 2015-10-16 MED ORDER — EMTRICITABINE-TENOFOVIR AF 200-25 MG PO TABS
1.0000 | ORAL_TABLET | Freq: Every day | ORAL | Status: DC
  Filled 2015-10-16: qty 1

## 2015-10-16 MED ORDER — DIAZEPAM 5 MG PO TABS
5.0000 mg | ORAL_TABLET | Freq: Four times a day (QID) | ORAL | Status: DC | PRN
  Administered 2015-10-16 – 2015-10-17 (×4): 10 mg via ORAL
  Filled 2015-10-16 (×3): qty 2

## 2015-10-16 MED ORDER — OXYCODONE-ACETAMINOPHEN 5-325 MG PO TABS
1.0000 | ORAL_TABLET | ORAL | Status: DC | PRN
  Administered 2015-10-16 – 2015-10-17 (×6): 2 via ORAL
  Filled 2015-10-16 (×6): qty 2

## 2015-10-16 MED ORDER — MENTHOL 3 MG MT LOZG: 1.0000 | LOZENGE | OROMUCOSAL | Status: DC | PRN

## 2015-10-16 MED ORDER — CEFAZOLIN SODIUM-DEXTROSE 2-4 GM/100ML-% IV SOLN
2.0000 g | INTRAVENOUS | Status: AC
  Administered 2015-10-16: 2 g via INTRAVENOUS
  Filled 2015-10-16: qty 100

## 2015-10-16 MED ORDER — PROPOFOL 10 MG/ML IV BOLUS
INTRAVENOUS | Status: DC | PRN
  Administered 2015-10-16: 140 mg via INTRAVENOUS

## 2015-10-16 MED ORDER — LACTATED RINGERS IV SOLN
INTRAVENOUS | Status: DC
  Administered 2015-10-16 (×3): via INTRAVENOUS

## 2015-10-16 MED ORDER — NEOSTIGMINE METHYLSULFATE 5 MG/5ML IV SOSY
PREFILLED_SYRINGE | INTRAVENOUS | Status: DC | PRN
  Administered 2015-10-16: 4 mg via INTRAVENOUS

## 2015-10-16 MED ORDER — VECURONIUM BROMIDE 10 MG IV SOLR
INTRAVENOUS | Status: DC | PRN
  Administered 2015-10-16: 2 mg via INTRAVENOUS
  Administered 2015-10-16: 4 mg via INTRAVENOUS
  Administered 2015-10-16: 2 mg via INTRAVENOUS

## 2015-10-16 MED ORDER — HYDROMORPHONE HCL 1 MG/ML IJ SOLN
0.2500 mg | INTRAMUSCULAR | Status: DC | PRN
  Administered 2015-10-16 (×4): 0.5 mg via INTRAVENOUS

## 2015-10-16 MED ORDER — 0.9 % SODIUM CHLORIDE (POUR BTL) OPTIME
TOPICAL | Status: DC | PRN
  Administered 2015-10-16: 1000 mL

## 2015-10-16 MED ORDER — BUPIVACAINE HCL (PF) 0.25 % IJ SOLN
INTRAMUSCULAR | Status: AC
  Filled 2015-10-16: qty 30

## 2015-10-16 MED ORDER — HYDROMORPHONE HCL 1 MG/ML IJ SOLN
1.0000 mg | Freq: Once | INTRAMUSCULAR | Status: AC
  Administered 2015-10-16: 1 mg via INTRAVENOUS

## 2015-10-16 MED ORDER — SUCCINYLCHOLINE CHLORIDE 20 MG/ML IJ SOLN
INTRAMUSCULAR | Status: DC | PRN
  Administered 2015-10-16: 100 mg via INTRAVENOUS

## 2015-10-16 MED ORDER — VANCOMYCIN HCL 1000 MG IV SOLR
INTRAVENOUS | Status: AC
  Filled 2015-10-16: qty 1000

## 2015-10-16 MED ORDER — FENTANYL CITRATE (PF) 100 MCG/2ML IJ SOLN
INTRAMUSCULAR | Status: DC | PRN
  Administered 2015-10-16 (×3): 25 ug via INTRAVENOUS
  Administered 2015-10-16: 100 ug via INTRAVENOUS
  Administered 2015-10-16: 25 ug via INTRAVENOUS

## 2015-10-16 MED ORDER — HYDROMORPHONE HCL 1 MG/ML IJ SOLN
INTRAMUSCULAR | Status: AC
  Filled 2015-10-16: qty 1

## 2015-10-16 MED ORDER — MIDAZOLAM HCL 2 MG/2ML IJ SOLN
INTRAMUSCULAR | Status: AC
  Filled 2015-10-16: qty 2

## 2015-10-16 MED ORDER — SODIUM CHLORIDE 0.9% FLUSH: 3.0000 mL | INTRAVENOUS | Status: DC | PRN

## 2015-10-16 MED ORDER — ONDANSETRON HCL 4 MG/2ML IJ SOLN
INTRAMUSCULAR | Status: AC
  Filled 2015-10-16: qty 2

## 2015-10-16 MED ORDER — MIDAZOLAM HCL 5 MG/5ML IJ SOLN
INTRAMUSCULAR | Status: DC | PRN
  Administered 2015-10-16: 2 mg via INTRAVENOUS

## 2015-10-16 MED ORDER — ASPIRIN EC 81 MG PO TBEC: 81.0000 mg | DELAYED_RELEASE_TABLET | Freq: Every day | ORAL | Status: DC

## 2015-10-16 MED ORDER — SODIUM CHLORIDE 0.9 % IV SOLN
250.0000 mL | INTRAVENOUS | Status: DC
Start: 2015-10-16 — End: 2015-10-17

## 2015-10-16 MED ORDER — BUDESONIDE 0.25 MG/2ML IN SUSP
0.2500 mg | Freq: Two times a day (BID) | RESPIRATORY_TRACT | Status: DC
  Administered 2015-10-16: 0.25 mg via RESPIRATORY_TRACT
  Filled 2015-10-16 (×3): qty 2

## 2015-10-16 MED ORDER — HYDROMORPHONE HCL 1 MG/ML IJ SOLN
0.5000 mg | INTRAMUSCULAR | Status: DC | PRN
  Administered 2015-10-16 – 2015-10-17 (×5): 1 mg via INTRAVENOUS
  Filled 2015-10-16 (×5): qty 1

## 2015-10-16 MED ORDER — ONDANSETRON HCL 4 MG/2ML IJ SOLN
INTRAMUSCULAR | Status: DC | PRN
  Administered 2015-10-16: 4 mg via INTRAVENOUS

## 2015-10-16 MED ORDER — LORATADINE 10 MG PO TABS
10.0000 mg | ORAL_TABLET | Freq: Every day | ORAL | Status: DC
  Administered 2015-10-16: 10 mg via ORAL
  Filled 2015-10-16: qty 1

## 2015-10-16 MED ORDER — CEFAZOLIN IN D5W 1 GM/50ML IV SOLN
1.0000 g | Freq: Three times a day (TID) | INTRAVENOUS | Status: AC
  Administered 2015-10-16 (×2): 1 g via INTRAVENOUS
  Filled 2015-10-16 (×2): qty 50

## 2015-10-16 MED ORDER — THROMBIN 20000 UNITS EX SOLR
CUTANEOUS | Status: AC
  Filled 2015-10-16: qty 20000

## 2015-10-16 MED ORDER — PHENYLEPHRINE HCL 10 MG/ML IJ SOLN
INTRAMUSCULAR | Status: DC | PRN
  Administered 2015-10-16 (×2): 40 ug via INTRAVENOUS

## 2015-10-16 MED ORDER — ACETAMINOPHEN 325 MG PO TABS: 650.0000 mg | ORAL_TABLET | ORAL | Status: DC | PRN

## 2015-10-16 MED ORDER — GLYCOPYRROLATE 0.2 MG/ML IV SOSY
PREFILLED_SYRINGE | INTRAVENOUS | Status: AC
  Filled 2015-10-16: qty 3

## 2015-10-16 MED ORDER — PHENOL 1.4 % MT LIQD: 1.0000 | OROMUCOSAL | Status: DC | PRN

## 2015-10-16 MED ORDER — ACETAMINOPHEN 650 MG RE SUPP: 650.0000 mg | RECTAL | Status: DC | PRN

## 2015-10-16 MED ORDER — SUCCINYLCHOLINE CHLORIDE 200 MG/10ML IV SOSY
PREFILLED_SYRINGE | INTRAVENOUS | Status: AC
  Filled 2015-10-16: qty 10

## 2015-10-16 MED ORDER — ARTIFICIAL TEARS OP OINT
TOPICAL_OINTMENT | OPHTHALMIC | Status: DC | PRN
  Administered 2015-10-16: 1 via OPHTHALMIC

## 2015-10-16 MED ORDER — ALBUTEROL SULFATE (2.5 MG/3ML) 0.083% IN NEBU: 2.5000 mg | INHALATION_SOLUTION | RESPIRATORY_TRACT | Status: DC | PRN

## 2015-10-16 MED ORDER — SURGIFOAM 100 EX MISC
CUTANEOUS | Status: DC | PRN
  Administered 2015-10-16: 20 mL via TOPICAL

## 2015-10-16 MED ORDER — FENTANYL CITRATE (PF) 100 MCG/2ML IJ SOLN
INTRAMUSCULAR | Status: AC
  Filled 2015-10-16: qty 2

## 2015-10-16 MED ORDER — DEXAMETHASONE SODIUM PHOSPHATE 10 MG/ML IJ SOLN
INTRAMUSCULAR | Status: AC
  Filled 2015-10-16: qty 1

## 2015-10-16 MED ORDER — CHLORHEXIDINE GLUCONATE CLOTH 2 % EX PADS: 6.0000 | MEDICATED_PAD | Freq: Once | CUTANEOUS | Status: DC

## 2015-10-16 MED ORDER — PHENYLEPHRINE 40 MCG/ML (10ML) SYRINGE FOR IV PUSH (FOR BLOOD PRESSURE SUPPORT)
PREFILLED_SYRINGE | INTRAVENOUS | Status: AC
  Filled 2015-10-16: qty 10

## 2015-10-16 MED ORDER — ALBUTEROL SULFATE 108 (90 BASE) MCG/ACT IN AEPB: 2.0000 | INHALATION_SPRAY | Freq: Four times a day (QID) | RESPIRATORY_TRACT | Status: DC | PRN

## 2015-10-16 MED ORDER — VANCOMYCIN HCL 1000 MG IV SOLR
INTRAVENOUS | Status: DC | PRN
  Administered 2015-10-16: 1000 mg via TOPICAL

## 2015-10-16 MED ORDER — DEXAMETHASONE SODIUM PHOSPHATE 10 MG/ML IJ SOLN
10.0000 mg | INTRAMUSCULAR | Status: AC
  Administered 2015-10-16: 10 mg via INTRAVENOUS
  Filled 2015-10-16: qty 1

## 2015-10-16 MED ORDER — HYDROCODONE-ACETAMINOPHEN 5-325 MG PO TABS: 1.0000 | ORAL_TABLET | ORAL | Status: DC | PRN

## 2015-10-16 MED ORDER — DIAZEPAM 5 MG PO TABS
ORAL_TABLET | ORAL | Status: AC
  Filled 2015-10-16: qty 2

## 2015-10-16 MED ORDER — FLUTICASONE PROPIONATE 50 MCG/ACT NA SUSP
2.0000 | Freq: Two times a day (BID) | NASAL | Status: DC
  Administered 2015-10-16: 2 via NASAL
  Filled 2015-10-16: qty 16

## 2015-10-16 MED ORDER — ARTIFICIAL TEARS OP OINT
TOPICAL_OINTMENT | OPHTHALMIC | Status: AC
  Filled 2015-10-16: qty 3.5

## 2015-10-16 MED ORDER — MAGNESIUM 200 MG PO TABS: 200.0000 mg | ORAL_TABLET | Freq: Every day | ORAL | Status: DC

## 2015-10-16 MED ORDER — MONTELUKAST SODIUM 10 MG PO TABS
10.0000 mg | ORAL_TABLET | Freq: Every day | ORAL | Status: DC
  Administered 2015-10-16: 10 mg via ORAL
  Filled 2015-10-16 (×2): qty 1

## 2015-10-16 MED ORDER — TRAMADOL HCL 50 MG PO TABS
50.0000 mg | ORAL_TABLET | Freq: Four times a day (QID) | ORAL | Status: DC | PRN
Start: 2015-10-16 — End: 2015-10-17

## 2015-10-16 MED FILL — Heparin Sodium (Porcine) Inj 1000 Unit/ML: INTRAMUSCULAR | Qty: 30 | Status: AC

## 2015-10-16 MED FILL — Sodium Chloride IV Soln 0.9%: INTRAVENOUS | Qty: 1000 | Status: AC

## 2015-10-16 SURGICAL SUPPLY — 66 items
ADH SKN CLS APL DERMABOND .7 (GAUZE/BANDAGES/DRESSINGS) ×1
APL SKNCLS STERI-STRIP NONHPOA (GAUZE/BANDAGES/DRESSINGS) ×1
BAG DECANTER FOR FLEXI CONT (MISCELLANEOUS) ×2 IMPLANT
BENZOIN TINCTURE PRP APPL 2/3 (GAUZE/BANDAGES/DRESSINGS) ×2 IMPLANT
BLADE CLIPPER SURG (BLADE) ×2 IMPLANT
BUR CUTTER 7.0 ROUND (BURR) IMPLANT
BUR MATCHSTICK NEURO 3.0 LAGG (BURR) ×2 IMPLANT
CANISTER SUCT 3000ML PPV (MISCELLANEOUS) ×2 IMPLANT
CAP LCK SPNE (Orthopedic Implant) ×6 IMPLANT
CAP LOCK SPINE RADIUS (Orthopedic Implant) IMPLANT
CAP LOCKING (Orthopedic Implant) ×12 IMPLANT
CONT SPEC 4OZ CLIKSEAL STRL BL (MISCELLANEOUS) ×2 IMPLANT
COVER BACK TABLE 60X90IN (DRAPES) ×2 IMPLANT
DECANTER SPIKE VIAL GLASS SM (MISCELLANEOUS) ×2 IMPLANT
DERMABOND ADVANCED (GAUZE/BANDAGES/DRESSINGS) ×1
DERMABOND ADVANCED .7 DNX12 (GAUZE/BANDAGES/DRESSINGS) ×1 IMPLANT
DEVICE INTERBODY ELEVATE 9X23 (Cage) ×2 IMPLANT
DRAPE C-ARM 42X72 X-RAY (DRAPES) ×4 IMPLANT
DRAPE HALF SHEET 40X57 (DRAPES) ×2 IMPLANT
DRAPE LAPAROTOMY 100X72X124 (DRAPES) ×2 IMPLANT
DRAPE POUCH INSTRU U-SHP 10X18 (DRAPES) ×2 IMPLANT
DRAPE SURG 17X23 STRL (DRAPES) ×8 IMPLANT
DRSG OPSITE POSTOP 4X6 (GAUZE/BANDAGES/DRESSINGS) ×1 IMPLANT
DURAPREP 26ML APPLICATOR (WOUND CARE) ×2 IMPLANT
ELECT REM PT RETURN 9FT ADLT (ELECTROSURGICAL) ×2
ELECTRODE REM PT RTRN 9FT ADLT (ELECTROSURGICAL) ×1 IMPLANT
EVACUATOR 1/8 PVC DRAIN (DRAIN) IMPLANT
GAUZE SPONGE 4X4 12PLY STRL (GAUZE/BANDAGES/DRESSINGS) IMPLANT
GAUZE SPONGE 4X4 16PLY XRAY LF (GAUZE/BANDAGES/DRESSINGS) IMPLANT
GLOVE BIOGEL PI IND STRL 7.0 (GLOVE) ×2 IMPLANT
GLOVE BIOGEL PI IND STRL 7.5 (GLOVE) ×2 IMPLANT
GLOVE BIOGEL PI INDICATOR 7.0 (GLOVE) ×2
GLOVE BIOGEL PI INDICATOR 7.5 (GLOVE) ×2
GLOVE ECLIPSE 9.0 STRL (GLOVE) ×4 IMPLANT
GLOVE EXAM NITRILE LRG STRL (GLOVE) IMPLANT
GLOVE EXAM NITRILE XL STR (GLOVE) IMPLANT
GLOVE EXAM NITRILE XS STR PU (GLOVE) IMPLANT
GLOVE SS BIOGEL STRL SZ 7 (GLOVE) ×1 IMPLANT
GLOVE SUPERSENSE BIOGEL SZ 7 (GLOVE) ×1
GLOVE SURG SS PI 7.0 STRL IVOR (GLOVE) ×2 IMPLANT
GOWN STRL REUS W/ TWL LRG LVL3 (GOWN DISPOSABLE) IMPLANT
GOWN STRL REUS W/ TWL XL LVL3 (GOWN DISPOSABLE) ×2 IMPLANT
GOWN STRL REUS W/TWL 2XL LVL3 (GOWN DISPOSABLE) IMPLANT
GOWN STRL REUS W/TWL LRG LVL3 (GOWN DISPOSABLE) ×2
GOWN STRL REUS W/TWL XL LVL3 (GOWN DISPOSABLE) ×6
GRAFT BN 5X1XSPNE CVD POST DBM (Bone Implant) ×1 IMPLANT
GRAFT BONE MAGNIFUSE 1X5CM (Bone Implant) ×2 IMPLANT
KIT BASIN OR (CUSTOM PROCEDURE TRAY) ×2 IMPLANT
KIT ROOM TURNOVER OR (KITS) ×2 IMPLANT
NEEDLE HYPO 22GX1.5 SAFETY (NEEDLE) ×2 IMPLANT
NS IRRIG 1000ML POUR BTL (IV SOLUTION) ×2 IMPLANT
PACK LAMINECTOMY NEURO (CUSTOM PROCEDURE TRAY) ×2 IMPLANT
ROD 5.5X60MM PURPLE (Rod) ×2 IMPLANT
SCREW 5.75X40M (Screw) ×2 IMPLANT
SCREW 5.75X45MM (Screw) ×4 IMPLANT
SCREW 6.75X40MM (Screw) ×4 IMPLANT
SPONGE SURGIFOAM ABS GEL 100 (HEMOSTASIS) ×2 IMPLANT
STRIP CLOSURE SKIN 1/2X4 (GAUZE/BANDAGES/DRESSINGS) ×2 IMPLANT
SUT VIC AB 0 CT1 18XCR BRD8 (SUTURE) ×2 IMPLANT
SUT VIC AB 0 CT1 8-18 (SUTURE) ×4
SUT VIC AB 2-0 CT1 18 (SUTURE) ×2 IMPLANT
SUT VIC AB 3-0 SH 8-18 (SUTURE) ×2 IMPLANT
TOWEL OR 17X24 6PK STRL BLUE (TOWEL DISPOSABLE) ×2 IMPLANT
TOWEL OR 17X26 10 PK STRL BLUE (TOWEL DISPOSABLE) ×2 IMPLANT
TRAY FOLEY W/METER SILVER 16FR (SET/KITS/TRAYS/PACK) ×2 IMPLANT
WATER STERILE IRR 1000ML POUR (IV SOLUTION) ×2 IMPLANT

## 2015-10-16 NOTE — H&P (Signed)
Jeff Hoffman is an 45 y.o. male.   Chief Complaint: Back pain HPI: 45 year old male with intractable back pain with bilateral lower extremity symptoms failing all conservative management. Workup demonstrates evidence of marked disc degeneration at L4-5 and L5-S1 with significant stenosis at L4-5. Patient has failed all efforts at conservative management. He presents now for decompression and fusion at L4-5 and fusion at L5-S1.  Past Medical History:  Diagnosis Date  . Allergy   . Asthma   . GERD (gastroesophageal reflux disease)   . Hypogonadism male     Past Surgical History:  Procedure Laterality Date  . arthroscopic knee Right 2010  . BACK SURGERY  2004   L4, L5 partial laminectomy and discetomy  . FOOT SURGERY Left 2006  . HERNIA REPAIR Right 1990's   inguinal hernia  . LAPAROSCOPIC APPENDECTOMY N/A 05/05/2014   Procedure: APPENDECTOMY LAPAROSCOPIC;  Surgeon: Avel Peace, MD;  Location: [facility];  Service: General;  Laterality: N/A;    History reviewed. No pertinent family history. Social History:  reports that he quit smoking about 14 years ago. He has a 15.00 pack-year smoking history. He has never used smokeless tobacco. He reports that he does not drink alcohol or use drugs.  Allergies:  Allergies  Allergen Reactions  . Erythromycin Other (See Comments)    Sharp stomach pains  . Other Rash and Other (See Comments)    (Tegaderm) Pt had clear tape in operating room that caused bad blisters. Was changed to paper tape and cloth tape and they worked well    Medications Prior to Admission  Medication Sig Dispense Refill  . Ascorbic Acid (VITAMIN C PO) Take 1 tablet by mouth daily.    Marland Kitchen aspirin EC 81 MG tablet Take 81 mg by mouth daily.    Marland Kitchen azelastine (ASTELIN) 0.1 % nasal spray USE 1 SPRAY IN EACH NOSTRIL TWICE A DAY. 30 mL 11  . Fexofenadine HCl (ALLEGRA PO) Take 1 tablet by mouth daily.    . fluticasone (FLONASE) 50 MCG/ACT nasal spray Place 2 sprays into both  nostrils daily. (Patient taking differently: Place 2 sprays into both nostrils 2 (two) times daily. ) 16 g 11  . Magnesium 200 MG TABS Take 200 mg by mouth daily.     . montelukast (SINGULAIR) 10 MG tablet TAKE 1 TABLET ONCE DAILY. 90 tablet 3  . naproxen sodium (ANAPROX) 220 MG tablet Take 440 mg by mouth 2 (two) times daily with a meal.    . Omega-3 Fatty Acids (FISH OIL) 1000 MG CAPS Take 1,000 mg by mouth daily.     Marland Kitchen omeprazole (PRILOSEC) 40 MG capsule TAKE 1 CAPSULE DAILY. (Patient taking differently: Take 40 mg by mouth daily) 90 capsule 1  . polycarbophil (FIBERCON) 625 MG tablet Take 625 mg by mouth daily.    . pseudoephedrine (SUDAFED) 60 MG tablet Take 60 mg by mouth every 4 (four) hours as needed for congestion.    Marland Kitchen QVAR 80 MCG/ACT inhaler USE 1 PUFF TWICE A DAY. RINSE MOUTH. (Patient taking differently: Inhale 1 puff by mouth twice daily. Rinse mouth.) 8.7 g 5  . tadalafil (CIALIS) 5 MG tablet Take 1 tablet (5 mg total) by mouth daily as needed for erectile dysfunction. (Patient taking differently: Take 5 mg by mouth daily. ) 30 tablet 11  . traMADol (ULTRAM) 50 MG tablet TAKE (1) TABLET EVERY EIGHT HOURS AS NEEDED FOR PAIN. 50 tablet 0  . TRUVADA 200-300 MG tablet TAKE 1 TABLET DAILY. 30 tablet  0  . acyclovir (ZOVIRAX) 400 MG tablet Take 400 mg by mouth 2 (two) times daily as needed (for flare ups). Reported on 06/13/2015    . Albuterol Sulfate (PROAIR RESPICLICK) 108 (90 BASE) MCG/ACT AEPB Inhale 2 puffs into the lungs 4 (four) times daily as needed. (Patient taking differently: Inhale 2 puffs into the lungs 4 (four) times daily as needed (for shortness of breath). ) 1 each 1  . doxycycline (VIBRA-TABS) 100 MG tablet Take 1 tablet (100 mg total) by mouth 2 (two) times daily. (Patient not taking: Reported on 10/03/2015) 28 tablet 0  . hydrocortisone cream 0.5 % Apply 1 application topically daily as needed for itching.    Marland Kitchen. ibuprofen (ADVIL,MOTRIN) 200 MG tablet Take 200 mg by mouth every  6 (six) hours as needed for headache or moderate pain. Reported on 06/13/2015      No results found for this or any previous visit (from the past 48 hour(s)). No results found.  Pertinent items noted in HPI and remainder of comprehensive ROS otherwise negative.  Blood pressure 135/87, pulse 61, temperature 98.1 F (36.7 C), temperature source Oral, resp. rate 20, weight 97.5 kg (215 lb), SpO2 98 %.  Patient is awake and alert. He is oriented and appropriate. Cranial nerve function is intact. Motor and sensory function of the extremities normal. Reflexes normal. Gait posture normal. Examination head ears eyes and throat is unremarkable. Chest and abdomen are benign. Extremities are free from injury deformity. Assessment/Plan L4-5 and L5-S1 degenerative disc disease with associated spondylosis and some foraminal stenosis. Plan bilateral L4-5 decompressive laminotomies and foraminotomies followed by posterior lumbar my fusion utilizing expandable cages and local autograft with posterior lateral arthrodesis from L5-S1 utilizing segmental pedicle screw instrumentation and local autograft. Risks and benefits been explained. Patient wishes to proceed.  Olie Dibert A 10/16/2015, 7:39 AM

## 2015-10-16 NOTE — Telephone Encounter (Signed)
Called pharmacy regatding P.A. & went thru for $1.94, think was possibly ran too soon & didn't need P.A.

## 2015-10-16 NOTE — Brief Op Note (Signed)
10/16/2015  10:24 AM  PATIENT:  Gladstone PihShannon A Dunnigan  45 y.o. male  PRE-OPERATIVE DIAGNOSIS:  DDD degenerative disc disease lumbar  POST-OPERATIVE DIAGNOSIS:  Degenerative Disc Disease Lumbar  PROCEDURE:  Procedure(s): Posterior Lumbar Interbody Fusion Lumbar Four-Five, Lumbar Four-Sacral One Post Lateral Fusion with pedicle screws (N/A)  SURGEON:  Surgeon(s) and Role:    * Julio SicksHenry Shavell Nored, MD - Primary    * Loura HaltBenjamin Jared Ditty, MD - Assisting  PHYSICIAN ASSISTANT:   ASSISTANTS:    ANESTHESIA:   general  EBL:  Total I/O In: 1000 [I.V.:1000] Out: 350 [Urine:100; Blood:250]  BLOOD ADMINISTERED:none  DRAINS: none   LOCAL MEDICATIONS USED:  MARCAINE     SPECIMEN:  No Specimen  DISPOSITION OF SPECIMEN:  N/A  COUNTS:  YES  TOURNIQUET:  * No tourniquets in log *  DICTATION: .Dragon Dictation  PLAN OF CARE: Admit to inpatient   PATIENT DISPOSITION:  PACU - hemodynamically stable.   Delay start of Pharmacological VTE agent (>24hrs) due to surgical blood loss or risk of bleeding: yes

## 2015-10-16 NOTE — Op Note (Signed)
Date of procedure: 10/16/2015  Date of dictation: Same  Service: Neurosurgery  Preoperative diagnosis: L4-5 degenerative disc disease with foraminal stenosis, L5-S1 degenerative disc disease  Postoperative diagnosis: Same  Procedure Name: Bilateral L4-5 decompressive laminotomies with bilateral L4 and L5 decompressive foraminotomies, more than would be required for simple interbody fusion alone.  L4-L5 and S1 posterior lateral arthrodesis utilizing segmental pedicle screw fixation and local autograft and morcellized allograft  Surgeon:Amerah Puleo A.Byrd Terrero, M.D.  Asst. Surgeon: Ditty  Anesthesia: General  Indication: 45 year old male status post previous L5-S1 laminotomy and discectomy on the left side many years ago. Patient with chronic intractable back pain. Workup demonstrates evidence of marked disc space collapse and degeneration at L5-S1 with early foraminal stenosis. At L4-5 the patient has evidence of progressive degenerative disc disease with a central disc protrusion and bilateral neural foraminal stenosis. Patient has failed conservative management and presents now for decompression and fusion at L4-5 with extension of his fusion and L5-S1.  Operative note: After induction of anesthesia, patient position prone onto Wilson frame and a properly padded. Lumbar region prepped and draped sterilely. Incision made overlying L4-5 and S1. Dissection performed bilaterally. Retractor placed. Fluoroscopy used. Levels confirmed. Wide decompressive laminotomies were performed bilaterally using Leksell rongeurs, Kerrison rongeurs the high-speed drill. The inferior two thirds of the lamina of L4 were removed the entire inferior facet of L4 was removed bilaterally the majority the superior facet of L5 was removed bilaterally and the superior rim of the L5 lamina was removed bilaterally. Ligament flavum was elevated and resected. Underlying thecal sac and exiting L4 and L5 nerve roots were done to 5. Wide  decompressive foraminotomies were completed along the course the exiting L4 and L5 nerve roots bilaterally. Bilateral discectomies were then performed at L4-5. Disc spaces and distracted. With the distractor left and patient's right side disc space was prepared for interbody fusion. Soft tissue was removed from the interspace. A 9 mm lordotic elevate cage from [employer] was packed with locally harvested autograft. This is impacted into place and then expanded to its full extent. Distractor was removed patient's right side. Disc space was prepared for fusion on the right side. Soft tissue once again removed from the interspace. Morselize autograft was then packed into the interspace and a second cage packed with autograft was then impacted into place and expanded to its full extent. Pedicles of L4-L5 and S1 were notified using surface landmarks and intraoperative fluoroscopy. Superficial bone overlying the pedicle was removed using a high-speed drill. Each pedicles and probed using a pedicle awl. Each pedicle awl track was probed and found to be solidly within the bone. The tracts were then tapped and each tap hole was probed and found to be solidly within the bone. Radius screws from [employer] were placed bilaterally at L4-L5 and S1. Screws were confirmed to be in good position as were the cages at the proper operative level with normal alignment of the spine by AP and lateral fluoroscopy. Wound is then irrigated with and like solution. Gelfoam was placed over the thecal sac and nerve roots. Morselize autograft and 9 diffuse were packed bilaterally at L4-L5 for later fusion. Short segment titanium rods and placed over the screw heads at L4-L5 and S1. Locking caps and placed over the screw heads. Locking caps were then engaged with the construct under mild compression. Vancomycin powder was placed the deep wound space. Wounds and close in layers with Vicryl sutures. Steri-Strips and sterile dressing were applied. No  apparent complications. Patient tolerated  the procedure well and he returns to the recovery room postop.`

## 2015-10-16 NOTE — Anesthesia Preprocedure Evaluation (Signed)
Anesthesia Evaluation  Patient identified by MRN, date of birth, ID band Patient awake    Reviewed: Allergy & Precautions, NPO status , Patient's Chart, lab work & pertinent test results  Airway Mallampati: II  TM Distance: >3 FB     Dental   Pulmonary asthma , former smoker,    breath sounds clear to auscultation       Cardiovascular negative cardio ROS   Rhythm:Regular Rate:Normal     Neuro/Psych    GI/Hepatic Neg liver ROS, GERD  ,  Endo/Other  negative endocrine ROS  Renal/GU negative Renal ROS     Musculoskeletal   Abdominal   Peds  Hematology   Anesthesia Other Findings   Reproductive/Obstetrics                             Anesthesia Physical Anesthesia Plan  ASA: III  Anesthesia Plan: General   Post-op Pain Management:    Induction: Intravenous  Airway Management Planned: Oral ETT  Additional Equipment:   Intra-op Plan:   Post-operative Plan: Possible Post-op intubation/ventilation  Informed Consent: I have reviewed the patients History and Physical, chart, labs and discussed the procedure including the risks, benefits and alternatives for the proposed anesthesia with the patient or authorized representative who has indicated his/her understanding and acceptance.   Dental advisory given  Plan Discussed with: CRNA and Anesthesiologist  Anesthesia Plan Comments:         Anesthesia Quick Evaluation

## 2015-10-16 NOTE — Progress Notes (Signed)
Report given to taylor rn as cargiver 

## 2015-10-16 NOTE — Anesthesia Procedure Notes (Signed)
Procedure Name: Intubation Date/Time: 10/16/2015 8:00 AM Performed by: Everlene BallsHAYES, Ming Kunka T Pre-anesthesia Checklist: Patient identified, Emergency Drugs available, Suction available, Patient being monitored and Timeout performed Patient Re-evaluated:Patient Re-evaluated prior to inductionOxygen Delivery Method: Circle system utilized Preoxygenation: Pre-oxygenation with 100% oxygen Intubation Type: IV induction Ventilation: Mask ventilation without difficulty Laryngoscope Size: Mac and 4 Grade View: Grade I Tube size: 7.5 mm Number of attempts: 1 Airway Equipment and Method: Stylet Placement Confirmation: ETT inserted through vocal cords under direct vision,  positive ETCO2,  CO2 detector and breath sounds checked- equal and bilateral Secured at: 23 cm Tube secured with: Tape Dental Injury: Teeth and Oropharynx as per pre-operative assessment

## 2015-10-16 NOTE — Transfer of Care (Signed)
Immediate Anesthesia Transfer of Care Note  Patient: Jeff Hoffman  Procedure(s) Performed: Procedure(s): Posterior Lumbar Interbody Fusion Lumbar Four-Five, Lumbar Four-Sacral One Post Lateral Fusion with pedicle screws (N/A)  Patient Location:   Anesthesia Type:General  Level of Consciousness: awake, alert , oriented and patient cooperative  Airway & Oxygen Therapy: Patient Spontanous Breathing and Patient connected to nasal cannula oxygen  Post-op Assessment: Report given to RN, Post -op Vital signs reviewed and stable, Patient moving all extremities and Patient able to stick tongue midline  Post vital signs: Reviewed and stable  Last Vitals:  Vitals:   10/16/15 1037 10/16/15 1045  BP:  127/73  Pulse: 75 61  Resp:  10  Temp: 36.8 C     Last Pain:  Vitals:   10/16/15 1045  TempSrc:   PainSc: 0-No pain         Complications: No apparent anesthesia complications

## 2015-10-17 MED ORDER — DIAZEPAM 5 MG PO TABS: 5.0000 mg | ORAL_TABLET | Freq: Four times a day (QID) | ORAL | 0 refills | Status: DC | PRN

## 2015-10-17 MED ORDER — OXYCODONE-ACETAMINOPHEN 5-325 MG PO TABS: 1.0000 | ORAL_TABLET | ORAL | 0 refills | Status: DC | PRN

## 2015-10-17 NOTE — Discharge Instructions (Signed)

## 2015-10-17 NOTE — Evaluation (Signed)
Occupational Therapy Evaluation and Discharge Patient Details Name: Jeff Hoffman MRN: [id number] DOB: 12/31/1970 Today's Date: 10/17/2015    History of Present Illness Pt is a 45 yo male who underwent bilat L4-5 PLIF due to L4-5 degenerative disc disease for formainal stenosis and L5-S1 degenerative disc disease.   Clinical Impression   PTA pt independent in ADL and mobility, Pt at supervision level for ADL and mod I for mobility. Pt received all precaution education. OT assessment and education complete. No further OT needs at this time.     Follow Up Recommendations  No OT follow up;Supervision - Intermittent    Equipment Recommendations  None recommended by OT    Recommendations for Other Services       Precautions / Restrictions Precautions Precautions: Fall;Back Precaution Booklet Issued: Yes (comment) Precaution Comments: pt with verbal understanding Required Braces or Orthoses: Spinal Brace Spinal Brace: Lumbar corset;Applied in standing position Restrictions Weight Bearing Restrictions: No      Mobility Bed Mobility Overal bed mobility: Modified Independent                Transfers Overall transfer level: Modified independent Equipment used: None             General transfer comment: v/c's for hand placement, increased time    Balance Overall balance assessment: No apparent balance deficits (not formally assessed)                                          ADL Overall ADL's : Needs assistance/impaired Eating/Feeding: Modified independent;Sitting   Grooming: Wash/dry hands;Oral care;Supervision/safety;Standing (educated in cup method for brushing teeth, and precautions)       Lower Body Bathing: Supervison/ safety;With adaptive equipment;Sit to/from stand (educated in long handle sponge)   Upper Body Dressing : Supervision/safety;Cueing for sequencing (don/doff of brace, able to direct caregiver)   Lower Body  Dressing: Minimal assistance;With caregiver independent assisting;Sit to/from stand   Toilet Transfer: Radiographer, therapeuticupervision/safety Toilet Transfer Details (indicate cue type and reason): simulated with recliner     Tub/ Shower Transfer: Walk-in shower;Modified independent;Ambulation   Functional mobility during ADLs: Modified independent General ADL Comments: Pt open to all precaution education     Vision     Perception     Praxis      Pertinent Vitals/Pain Pain Assessment: 0-10 Pain Score: 3  Pain Location: surgical site Pain Descriptors / Indicators: Sore Pain Intervention(s): Monitored during session;Repositioned     Hand Dominance Right   Extremity/Trunk Assessment Upper Extremity Assessment Upper Extremity Assessment: Overall WFL for tasks assessed   Lower Extremity Assessment Lower Extremity Assessment: Overall WFL for tasks assessed   Cervical / Trunk Assessment Cervical / Trunk Assessment: Other exceptions (back surgery)   Communication Communication Communication: No difficulties   Cognition Arousal/Alertness: Awake/alert Behavior During Therapy: WFL for tasks assessed/performed Overall Cognitive Status: Within Functional Limits for tasks assessed                     General Comments       Exercises       Shoulder Instructions      Home Living Family/patient expects to be discharged to:: Private residence Living Arrangements: Spouse/significant other Available Help at Discharge: Family;Other (Comment) (mom is staying with him for a week, husband works in [employer]) Type of Home: House Home Access: Stairs to enter Entergy CorporationEntrance Stairs-Number of Steps:  2 Entrance Stairs-Rails: None Home Layout: Two level Alternate Level Stairs-Number of Steps: 10 Alternate Level Stairs-Rails: Right Bathroom Shower/Tub: Producer, television/film/video: Handicapped height     Home Equipment: None          Prior Functioning/Environment Level of Independence:  Independent        Comments: 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 Problem List: Decreased range of motion;Decreased knowledge of precautions;Pain   OT Treatment/Interventions:      OT Goals(Current goals can be found in the care plan section) Acute Rehab OT Goals Patient Stated Goal: home OT Goal Formulation: All assessment and education complete, DC therapy  OT Frequency:     Barriers to D/C:            Co-evaluation              End of Session Equipment Utilized During Treatment: Back brace Nurse Communication: Mobility status  Activity Tolerance: Patient tolerated treatment well Patient left: in bed;with call bell/phone within reach   Time: 0815-0843 OT Time Calculation (min): 28 min Charges:  OT General Charges $OT Visit: 1 Procedure OT Evaluation $OT Eval Low Complexity: 1 Procedure OT Treatments $Self Care/Home Management : 8-22 mins G-Codes:    Evern Bio Meril Dray 2015/10/22, 5:34 PM  Sherryl Manges OTR/L [phone]

## 2015-10-17 NOTE — Discharge Summary (Signed)
Physician Discharge Summary  Patient ID: Jeff PihShannon A Helmer MRN: [id number] DOB/AGE: 47/08/1970 47 y.o.  Admit date: 10/16/2015 Discharge date: 10/17/2015  Admission Diagnoses:  Discharge Diagnoses:  Active Problems:   Lumbar foraminal stenosis Lumbar degenerative disc disease L4-5, L5-S1  Discharged Condition: good  Hospital Course: Patient admitted to hospital where he underwent uncomplicated L4-5 and S1 fusion. Postoperative use doing very well. Preoperative back and lower chamois pain improved. Ambulating without difficulty. Ready for discharge home.  Consults:   Significant Diagnostic Studies:   Treatments:   Discharge Exam: Blood pressure 119/64, pulse 74, temperature 97.7 F (36.5 C), temperature source Oral, resp. rate 18, weight 97.5 kg (215 lb), SpO2 99 %. Awake and alert. Oriented and appropriate. Cranial nerve function intact. Motor and sensory function extremities normal. Wound clean and dry. Chest and abdomen benign.  Disposition: 06-Home-Health Care Svc     Medication List    TAKE these medications   acyclovir 400 MG tablet Commonly known as:  ZOVIRAX Take 400 mg by mouth 2 (two) times daily as needed (for flare ups). Reported on 06/13/2015   Albuterol Sulfate 108 (90 Base) MCG/ACT Aepb Commonly known as:  PROAIR RESPICLICK Inhale 2 puffs into the lungs 4 (four) times daily as needed. What changed:  reasons to take this   ALLEGRA PO Take 1 tablet by mouth daily.   aspirin EC 81 MG tablet Take 81 mg by mouth daily.   azelastine 0.1 % nasal spray Commonly known as:  ASTELIN USE 1 SPRAY IN EACH NOSTRIL TWICE A DAY.   diazepam 5 MG tablet Commonly known as:  VALIUM Take 1-2 tablets (5-10 mg total) by mouth every 6 (six) hours as needed for muscle spasms.   doxycycline 100 MG tablet Commonly known as:  VIBRA-TABS Take 1 tablet (100 mg total) by mouth 2 (two) times daily.   Fish Oil 1000 MG Caps Take 1,000 mg by mouth daily.   fluticasone 50  MCG/ACT nasal spray Commonly known as:  FLONASE Place 2 sprays into both nostrils daily. What changed:  when to take this   hydrocortisone cream 0.5 % Apply 1 application topically daily as needed for itching.   ibuprofen 200 MG tablet Commonly known as:  ADVIL,MOTRIN Take 200 mg by mouth every 6 (six) hours as needed for headache or moderate pain. Reported on 06/13/2015   Magnesium 200 MG Tabs Take 200 mg by mouth daily.   montelukast 10 MG tablet Commonly known as:  SINGULAIR TAKE 1 TABLET ONCE DAILY.   naproxen sodium 220 MG tablet Commonly known as:  ANAPROX Take 440 mg by mouth 2 (two) times daily with a meal.   omeprazole 40 MG capsule Commonly known as:  PRILOSEC TAKE 1 CAPSULE DAILY. What changed:  See the new instructions.   oxyCODONE-acetaminophen 5-325 MG tablet Commonly known as:  PERCOCET/ROXICET Take 1-2 tablets by mouth every 4 (four) hours as needed for moderate pain.   polycarbophil 625 MG tablet Commonly known as:  FIBERCON Take 625 mg by mouth daily.   pseudoephedrine 60 MG tablet Commonly known as:  SUDAFED Take 60 mg by mouth every 4 (four) hours as needed for congestion.   QVAR 80 MCG/ACT inhaler Generic drug:  beclomethasone USE 1 PUFF TWICE A DAY. RINSE MOUTH. What changed:  See the new instructions.   tadalafil 5 MG tablet Commonly known as:  CIALIS Take 1 tablet (5 mg total) by mouth daily as needed for erectile dysfunction. What changed:  when to take this  traMADol 50 MG tablet Commonly known as:  ULTRAM TAKE (1) TABLET EVERY EIGHT HOURS AS NEEDED FOR PAIN.   TRUVADA 200-300 MG tablet Generic drug:  emtricitabine-tenofovir TAKE 1 TABLET DAILY.   VITAMIN C PO Take 1 tablet by mouth daily.      Follow-up Information    Temple Pacini, MD .   Specialty:  Neurosurgery Contact information: [address] [place] [address] [phone]           Signed: Temple Pacini 10/17/2015, 9:19 AM

## 2015-10-17 NOTE — Evaluation (Signed)
Physical Therapy Evaluation and Discharge Patient Details Name: Jeff Hoffman MRN: [id number] DOB: 25-Aug-1970 Today's Date: 10/17/2015   History of Present Illness  Pt is a 45 yo male who underwent bilat L4-5 PLIF due to L4-5 degenerative disc disease for formainal stenosis and L5-S1 degenerative disc disease.  Clinical Impression  Patient is s/p above surgery resulting in the deficits listed below (see PT Problem List). Pt mod I with all mobility. Patient will benefit from skilled PT to increase their independence and safety with mobility (while adhering to their precautions) to allow discharge to the venue listed below. Pt with no further acute PT needs. PT SIGNING OFF. Please re-consult if needed in future.     Follow Up Recommendations No PT follow up;Supervision - Intermittent    Equipment Recommendations  None recommended by PT    Recommendations for Other Services       Precautions / Restrictions Precautions Precautions: Fall;Back Precaution Booklet Issued: Yes (comment) Precaution Comments: pt with verbal understanding Required Braces or Orthoses: Spinal Brace Spinal Brace: Lumbar corset;Applied in standing position Restrictions Weight Bearing Restrictions: No      Mobility  Bed Mobility Overal bed mobility: Modified Independent             General bed mobility comments: v/c's for logroll technique  Transfers Overall transfer level: Modified independent Equipment used: None             General transfer comment: v/c's for hand placement, increased time  Ambulation/Gait Ambulation/Gait assistance: Modified independent (Device/Increase time) Ambulation Distance (Feet): 300 Feet Assistive device: None Gait Pattern/deviations: WFL(Within Functional Limits)     General Gait Details: mild decrease in step length due to back surgery and being cautious otherwise wfl  Stairs Stairs: Yes Stairs assistance: Modified independent (Device/Increase  time) Stair Management: One rail Right Number of Stairs: 10 General stair comments: no episodes of LOB  Wheelchair Mobility    Modified Rankin (Stroke Patients Only)       Balance                                             Pertinent Vitals/Pain Pain Assessment: 0-10 Pain Score: 3  Pain Location: surgical site Pain Intervention(s): Monitored during session    Home Living Family/patient expects to be discharged to:: Private residence Living Arrangements: Spouse/significant other Available Help at Discharge: Family;Friend(s);Available 24 hours/day (mom in town for a week, spouse works during the day) Type of Home: House Home Access: Stairs to enter Entrance Stairs-Rails: None Secretary/administrator of Steps: 2 Home Layout: Two level Home Equipment: None      Prior Function Level of Independence: Independent         Comments: 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   Dominant Hand: Right    Extremity/Trunk Assessment   Upper Extremity Assessment: Overall WFL for tasks assessed           Lower Extremity Assessment: Overall WFL for tasks assessed      Cervical / Trunk Assessment: Other exceptions (back surgery)  Communication   Communication: No difficulties  Cognition Arousal/Alertness: Awake/alert Behavior During Therapy: WFL for tasks assessed/performed Overall Cognitive Status: Within Functional Limits for tasks assessed                      General Comments General comments (skin integrity,  edema, etc.): surgical site in take    Exercises     Assessment/Plan    PT Assessment Patent does not need any further PT services  PT Problem List            PT Treatment Interventions      PT Goals (Current goals can be found in the Care Plan section)  Acute Rehab PT Goals Patient Stated Goal: home PT Goal Formulation: All assessment and education complete, DC therapy Potential to Achieve Goals:  Good    Frequency     Barriers to discharge        Co-evaluation               End of Session Equipment Utilized During Treatment: Back brace Activity Tolerance: Patient tolerated treatment well Patient left: in chair;with call bell/phone within reach Nurse Communication: Mobility status         Time: 0730-0759 PT Time Calculation (min) (ACUTE ONLY): 29 min   Charges:   PT Evaluation $PT Eval Moderate Complexity: 1 Procedure PT Treatments $Gait Training: 8-22 mins   PT G CodesMarcene Brawn:        Saima Monterroso Marie 10/17/2015, 8:20 AM   Lewis ShockAshly Jancy Sprankle, PT, DPT Pager #: [phone] Office #: [phone]

## 2015-10-17 NOTE — Progress Notes (Signed)
Pt and mother given D/C instructions with Rx's, verbal understanding was provided. Pt's incision is covered with Honeycomb dressing and has no sign of infection. Pt's IV was removed prior to D/C. Pt D/C'd home via wheelchair @ 1015 per MD order. Pt is stable @ D/C and has no other needs at this time. Rema FendtAshley Keldon Lassen, RN

## 2015-10-18 ENCOUNTER — Encounter (HOSPITAL_COMMUNITY): Payer: Self-pay | Admitting: Emergency Medicine

## 2015-10-18 DIAGNOSIS — Z87891 Personal history of nicotine dependence: Secondary | ICD-10-CM | POA: Diagnosis not present

## 2015-10-18 DIAGNOSIS — Z7982 Long term (current) use of aspirin: Secondary | ICD-10-CM | POA: Insufficient documentation

## 2015-10-18 DIAGNOSIS — J45909 Unspecified asthma, uncomplicated: Secondary | ICD-10-CM | POA: Insufficient documentation

## 2015-10-18 DIAGNOSIS — R066 Hiccough: Secondary | ICD-10-CM | POA: Insufficient documentation

## 2015-10-18 NOTE — Anesthesia Postprocedure Evaluation (Signed)
Anesthesia Post Note  Patient: Jeff Hoffman  Procedure(s) Performed: Procedure(s) (LRB): Posterior Lumbar Interbody Fusion Lumbar Four-Five, Lumbar Four-Sacral One Post Lateral Fusion with pedicle screws (N/A)  Patient location during evaluation: PACU Anesthesia Type: General Level of consciousness: awake Vital Signs Assessment: post-procedure vital signs reviewed and stable Respiratory status: spontaneous breathing Cardiovascular status: stable    Last Vitals:  Vitals:   10/17/15 0400 10/17/15 0741  BP: 114/63 119/64  Pulse: 83 74  Resp: 20 18  Temp: 36.8 C 36.5 C    Last Pain:  Vitals:   10/17/15 0950  TempSrc:   PainSc: 8                  EDWARDS,Peter Keyworth

## 2015-10-18 NOTE — ED Triage Notes (Signed)
Pt. reports persistent hiccups for 2 days unrelieved by prescription medication , pt. added constipation for 3 days unrelieved by laxative and stool softener. Denies fever or chills . Recent back surgery this Tuesday .

## 2015-10-19 ENCOUNTER — Emergency Department (HOSPITAL_COMMUNITY)
Admission: EM | Admit: 2015-10-19 | Discharge: 2015-10-19 | Disposition: A | Discharge status: Home / Self Care | Payer: BLUE CROSS/BLUE SHIELD | Attending: Emergency Medicine | Admitting: Emergency Medicine

## 2015-10-19 DIAGNOSIS — R066 Hiccough: Secondary | ICD-10-CM

## 2015-10-19 LAB — CBC WITH DIFFERENTIAL/PLATELET
BASOS ABS: 0 10*3/uL (ref 0.0–0.1)
Basophils Relative: 1 %
EOS PCT: 2 %
Eosinophils Absolute: 0.1 10*3/uL (ref 0.0–0.7)
HEMATOCRIT: 34.9 % — AB (ref 39.0–52.0)
Hemoglobin: 11.6 g/dL — ABNORMAL LOW (ref 13.0–17.0)
LYMPHS PCT: 36 %
Lymphs Abs: 1.4 10*3/uL (ref 0.7–4.0)
MCH: 28 pg (ref 26.0–34.0)
MCHC: 33.2 g/dL (ref 30.0–36.0)
MCV: 84.1 fL (ref 78.0–100.0)
Monocytes Absolute: 0.7 10*3/uL (ref 0.1–1.0)
Monocytes Relative: 18 %
NEUTROS ABS: 1.7 10*3/uL (ref 1.7–7.7)
NEUTROS PCT: 43 %
PLATELETS: 140 10*3/uL — AB (ref 150–400)
RBC: 4.15 MIL/uL — AB (ref 4.22–5.81)
RDW: 13.3 % (ref 11.5–15.5)
WBC: 3.8 10*3/uL — AB (ref 4.0–10.5)

## 2015-10-19 LAB — COMPREHENSIVE METABOLIC PANEL
ALT: 18 U/L (ref 17–63)
AST: 27 U/L (ref 15–41)
Albumin: 3.9 g/dL (ref 3.5–5.0)
Alkaline Phosphatase: 58 U/L (ref 38–126)
Anion gap: 8 (ref 5–15)
BUN: 13 mg/dL (ref 6–20)
CHLORIDE: 97 mmol/L — AB (ref 101–111)
CO2: 30 mmol/L (ref 22–32)
CREATININE: 0.96 mg/dL (ref 0.61–1.24)
Calcium: 9.2 mg/dL (ref 8.9–10.3)
GFR calc Af Amer: >60 mL/min (ref >60)
Glucose, Bld: 113 mg/dL — ABNORMAL HIGH (ref 65–99)
Potassium: 4 mmol/L (ref 3.5–5.1)
Sodium: 135 mmol/L (ref 135–145)
Total Bilirubin: 0.5 mg/dL (ref 0.3–1.2)
Total Protein: 6.7 g/dL (ref 6.5–8.1)

## 2015-10-19 LAB — I-STAT TROPONIN, ED: TROPONIN I, POC: 0.01 ng/mL (ref 0.00–0.08)

## 2015-10-19 MED ORDER — CHLORPROMAZINE HCL 25 MG PO TABS: 25.0000 mg | ORAL_TABLET | Freq: Three times a day (TID) | ORAL | 0 refills | Status: DC | PRN

## 2015-10-19 MED ORDER — CHLORPROMAZINE HCL 25 MG PO TABS
50.0000 mg | ORAL_TABLET | Freq: Once | ORAL | Status: AC
  Administered 2015-10-19: 50 mg via ORAL
  Filled 2015-10-19: qty 2

## 2015-10-19 NOTE — ED Provider Notes (Signed)
[facility]-EMERGENCY DEPT Provider Note   CSN: [id number] Arrival date & time: 10/18/15  2225     History   Chief Complaint Chief Complaint  Patient presents with  . Intractable Hiccups  . Constipation    HPI Jeff Hoffman is a 45 y.o. male.  Patient presents to the emergency department with chief complaint of hiccups.  He states that he has been having hiccups for the past 37 hours.  He reports that his symptoms started the day after coming home from a lumbar spine surgery. He had a fusion of L4/L5.  He states that he has been taking Percocet and Valium, but reports that he has significant back pain because the hiccups.  He denies any fevers, chills, nausea, or vomiting. He reports that he has been constipated. He denies any other associated symptoms. Denies any chest pain or shortness of breath.    The history is provided by the patient. No language interpreter was used.    Past Medical History:  Diagnosis Date  . Allergy   . Asthma   . GERD (gastroesophageal reflux disease)   . Hypogonadism male     Patient Active Problem List   Diagnosis Date Noted  . Lumbar foraminal stenosis 10/16/2015  . Risky sexual behavior 07/04/2015  . Erectile dysfunction 07/04/2015  . Asthma, chronic 02/28/2013  . GERD (gastroesophageal reflux disease) 02/28/2013  . Allergic rhinitis 02/28/2013  . Herpes 02/28/2013  . Hypogonadism male 06/21/2010    Past Surgical History:  Procedure Laterality Date  . arthroscopic knee Right 2010  . BACK SURGERY  2004   L4, L5 partial laminectomy and discetomy  . FOOT SURGERY Left 2006  . HERNIA REPAIR Right 1990's   inguinal hernia  . LAPAROSCOPIC APPENDECTOMY N/A 05/05/2014   Procedure: APPENDECTOMY LAPAROSCOPIC;  Surgeon: Avel Peace, MD;  Location: [facility] ORS;  Service: General;  Laterality: N/A;       Home Medications    Prior to Admission medications   Medication Sig Start Date End Date Taking? Authorizing Provider  acyclovir (ZOVIRAX)  400 MG tablet Take 400 mg by mouth 2 (two) times daily as needed (for flare ups). Reported on 06/13/2015 01/20/12  Yes Ronnald Nian, MD  Albuterol Sulfate (PROAIR RESPICLICK) 108 (90 BASE) MCG/ACT AEPB Inhale 2 puffs into the lungs 4 (four) times daily as needed. Patient taking differently: Inhale 2 puffs into the lungs 4 (four) times daily as needed (for shortness of breath).  10/05/14  Yes Ronnald Nian, MD  Ascorbic Acid (VITAMIN C PO) Take 1 tablet by mouth daily.   Yes Historical Provider, MD  aspirin EC 81 MG tablet Take 81 mg by mouth daily.   Yes Historical Provider, MD  azelastine (ASTELIN) 0.1 % nasal spray USE 1 SPRAY IN EACH NOSTRIL TWICE A DAY. 10/13/15  Yes Ronnald Nian, MD  diazepam (VALIUM) 5 MG tablet Take 1-2 tablets (5-10 mg total) by mouth every 6 (six) hours as needed for muscle spasms. 10/17/15  Yes Julio Sicks, MD  Fexofenadine HCl (ALLEGRA PO) Take 1 tablet by mouth daily.   Yes Historical Provider, MD  fluticasone (FLONASE) 50 MCG/ACT nasal spray Place 2 sprays into both nostrils daily. Patient taking differently: Place 2 sprays into both nostrils 2 (two) times daily.  02/28/13  Yes Ronnald Nian, MD  hydrocortisone cream 0.5 % Apply 1 application topically daily as needed for itching.   Yes Historical Provider, MD  ibuprofen (ADVIL,MOTRIN) 200 MG tablet Take 200 mg by mouth every 6 (  six) hours as needed for headache or moderate pain. Reported on 06/13/2015   Yes Historical Provider, MD  Magnesium 200 MG TABS Take 200 mg by mouth daily.    Yes Historical Provider, MD  montelukast (SINGULAIR) 10 MG tablet TAKE 1 TABLET ONCE DAILY. 10/13/15  Yes Ronnald NianJohn C Lalonde, MD  naproxen sodium (ANAPROX) 220 MG tablet Take 440 mg by mouth 2 (two) times daily with a meal.   Yes Historical Provider, MD  Omega-3 Fatty Acids (FISH OIL) 1000 MG CAPS Take 1,000 mg by mouth daily.    Yes Historical Provider, MD  omeprazole (PRILOSEC) 40 MG capsule TAKE 1 CAPSULE DAILY. Patient taking differently: Take  40 mg by mouth daily 06/26/15  Yes Ronnald NianJohn C Lalonde, MD  oxyCODONE-acetaminophen (PERCOCET/ROXICET) 5-325 MG tablet Take 1-2 tablets by mouth every 4 (four) hours as needed for moderate pain. 10/17/15  Yes Julio SicksHenry Pool, MD  polycarbophil (FIBERCON) 625 MG tablet Take 625 mg by mouth daily.   Yes Historical Provider, MD  pseudoephedrine (SUDAFED) 60 MG tablet Take 60 mg by mouth every 4 (four) hours as needed for congestion.   Yes Historical Provider, MD  QVAR 80 MCG/ACT inhaler USE 1 PUFF TWICE A DAY. RINSE MOUTH. Patient taking differently: Inhale 1 puff by mouth twice daily. Rinse mouth. 03/20/15  Yes Ronnald NianJohn C Lalonde, MD  tadalafil (CIALIS) 5 MG tablet Take 1 tablet (5 mg total) by mouth daily as needed for erectile dysfunction. Patient taking differently: Take 5 mg by mouth daily.  07/30/15  Yes Ronnald NianJohn C Lalonde, MD  traMADol (ULTRAM) 50 MG tablet TAKE (1) TABLET EVERY EIGHT HOURS AS NEEDED FOR PAIN. 10/10/15  Yes Ronnald NianJohn C Lalonde, MD  TRUVADA 200-300 MG tablet TAKE 1 TABLET DAILY. 10/13/15  Yes Ronnald NianJohn C Lalonde, MD    Family History No family history on file.  Social History Social History  Substance Use Topics  . Smoking status: Former Smoker    Packs/day: 1.00    Years: 15.00    Quit date: 01/06/2001  . Smokeless tobacco: Never Used  . Alcohol use No     Allergies   Erythromycin and Other   Review of Systems Review of Systems  Gastrointestinal: Positive for constipation.  Musculoskeletal: Positive for back pain.  All other systems reviewed and are negative.    Physical Exam Updated Vital Signs BP 130/77   Pulse 102   Temp 98.8 F (37.1 C) (Oral)   Resp 20   SpO2 100%   Physical Exam  Constitutional: He is oriented to person, place, and time. He appears well-developed and well-nourished.  HENT:  Head: Normocephalic and atraumatic.  Eyes: Conjunctivae and EOM are normal. Pupils are equal, round, and reactive to light. Right eye exhibits no discharge. Left eye exhibits no  discharge. No scleral icterus.  Neck: Normal range of motion. Neck supple. No JVD present.  Cardiovascular: Regular rhythm and normal heart sounds.  Exam reveals no gallop and no friction rub.   No murmur heard. Mild tachycardia  Pulmonary/Chest: Effort normal and breath sounds normal. No respiratory distress. He has no wheezes. He has no rales. He exhibits no tenderness.  Abdominal: Soft. He exhibits no distension and no mass. There is no tenderness. There is no rebound and no guarding.  Musculoskeletal: Normal range of motion. He exhibits no edema or tenderness.  Neurological: He is alert and oriented to person, place, and time.  Skin: Skin is warm and dry.  Recent surgical incision is without evidence of infection or cellulitis  or discharge  Psychiatric: He has a normal mood and affect. His behavior is normal. Judgment and thought content normal.  Nursing note and vitals reviewed.    ED Treatments / Results  Labs (all labs ordered are listed, but only abnormal results are displayed) Labs Reviewed  CBC WITH DIFFERENTIAL/PLATELET - Abnormal; Notable for the following:       Result Value   WBC 3.8 (*)    RBC 4.15 (*)    Hemoglobin 11.6 (*)    HCT 34.9 (*)    Platelets 140 (*)    All other components within normal limits  COMPREHENSIVE METABOLIC PANEL - Abnormal; Notable for the following:    Chloride 97 (*)    Glucose, Bld 113 (*)    All other components within normal limits  I-STAT TROPOININ, ED    EKG  EKG Interpretation None       Radiology No results found.  Procedures Procedures (including critical care time)  Medications Ordered in ED Medications  chlorproMAZINE (THORAZINE) tablet 50 mg (not administered)     Initial Impression / Assessment and Plan / ED Course  I have reviewed the triage vital signs and the nursing notes.  Pertinent labs & imaging results that were available during my care of the patient were reviewed by me and considered in my medical  decision making (see chart for details).  Clinical Course    Patient with hiccups x 37 hours.  Will try Thorazine, and will reassess. We'll also check EKG and troponin.  5:43 AM Hiccups resolved with Thorazine. Patient is resting comfortably. Will discharge to home. Laboratory workup reassuring.  Vitals:   10/19/15 0330 10/19/15 0400  BP: 116/62 111/68  Pulse: 109 98  Resp: 14 12  Temp:       Final Clinical Impressions(s) / ED Diagnoses   Final diagnoses:  Intractable hiccups    New Prescriptions New Prescriptions   CHLORPROMAZINE (THORAZINE) 25 MG TABLET    Take 1 tablet (25 mg total) by mouth 3 (three) times daily as needed. As needed for hiccups     Roxy Horseman, PA-C 10/19/15 0543    Layla Maw Ward, DO 10/19/15 1610

## 2015-10-20 ENCOUNTER — Other Ambulatory Visit: Payer: Self-pay | Admitting: Family Medicine

## 2015-10-20 MED ORDER — CHLORPROMAZINE HCL 25 MG PO TABS: 25.0000 mg | ORAL_TABLET | Freq: Three times a day (TID) | ORAL | 0 refills | Status: DC | PRN

## 2015-10-22 ENCOUNTER — Encounter: Payer: Self-pay | Admitting: Family Medicine

## 2015-10-23 ENCOUNTER — Ambulatory Visit (INDEPENDENT_AMBULATORY_CARE_PROVIDER_SITE_OTHER): Payer: BLUE CROSS/BLUE SHIELD | Admitting: Family Medicine

## 2015-10-23 ENCOUNTER — Encounter: Payer: Self-pay | Admitting: Family Medicine

## 2015-10-23 ENCOUNTER — Ambulatory Visit
Admission: RE | Admit: 2015-10-23 | Discharge: 2015-10-23 | Disposition: A | Discharge status: Home / Self Care | Payer: BLUE CROSS/BLUE SHIELD | Source: Ambulatory Visit | Attending: Family Medicine | Admitting: Family Medicine

## 2015-10-23 VITALS — BP 120/70 | HR 105 | Temp 98.0°F

## 2015-10-23 DIAGNOSIS — J029 Acute pharyngitis, unspecified: Secondary | ICD-10-CM

## 2015-10-23 DIAGNOSIS — R05 Cough: Secondary | ICD-10-CM | POA: Diagnosis not present

## 2015-10-23 DIAGNOSIS — R059 Cough, unspecified: Secondary | ICD-10-CM

## 2015-10-23 DIAGNOSIS — R066 Hiccough: Secondary | ICD-10-CM | POA: Diagnosis not present

## 2015-10-23 LAB — POCT RAPID STREP A (OFFICE): RAPID STREP A SCREEN: NEGATIVE

## 2015-10-23 MED ORDER — CHLORPROMAZINE HCL 25 MG PO TABS: 25.0000 mg | ORAL_TABLET | Freq: Three times a day (TID) | ORAL | 0 refills | Status: DC | PRN

## 2015-10-23 NOTE — Progress Notes (Signed)
   Subjective:    Patient ID: Jeff Hoffman, male    DOB: 03/08/1970, 45 y.o.   MRN: [id number]  HPI He is here for evaluation of continued difficulty with pickups and also sore throat. He has an underlying history of reflux disease and has been using Prilosec regularly. He notes that when he has difficulty with pickups it also makes him cough and he feels as if he is aspirating. The headache of started 1 day after he had lumbar disc surgery. He was given Thorazine which has helped with pickups. He also is using Maalox to help with the reflux symptoms. He has also had some difficulty with constipation and has been using a stool softener with good results.   Review of Systems     Objective:   Physical Exam Alert and pale appearing. Throat is clear. Neck is supple without adenopathy. Strep screen is negative. Lungs are clear to auscultation.      Assessment & Plan:  Intractable hiccups - Plan: DG Chest 2 View, chlorproMAZINE (THORAZINE) 25 MG tablet  Cough - Plan: DG Chest 2 View  Sore throat - Plan: POCT rapid strep A His case was discussed with Dr. Elnoria HowardHung who agreed with my approach with continued use of Thorazine. Will also see what the chest x-ray shows and if evidence of aspiration, we will treat that.

## 2015-10-24 ENCOUNTER — Encounter: Payer: Self-pay | Admitting: Family Medicine

## 2015-11-02 ENCOUNTER — Encounter: Payer: Self-pay | Admitting: Family Medicine

## 2015-11-02 ENCOUNTER — Ambulatory Visit (INDEPENDENT_AMBULATORY_CARE_PROVIDER_SITE_OTHER): Payer: BLUE CROSS/BLUE SHIELD | Admitting: Family Medicine

## 2015-11-02 VITALS — BP 120/80 | HR 87 | Temp 98.0°F | Wt 210.0 lb

## 2015-11-02 DIAGNOSIS — R05 Cough: Secondary | ICD-10-CM

## 2015-11-02 DIAGNOSIS — R059 Cough, unspecified: Secondary | ICD-10-CM

## 2015-11-02 MED ORDER — AZITHROMYCIN 250 MG PO TABS: ORAL_TABLET | ORAL | 0 refills | Status: DC

## 2015-11-02 NOTE — Progress Notes (Signed)
   Subjective:    Patient ID: Jeff Hoffman, male    DOB: 12/26/1970, 45 y.o.   MRN: [id number]  HPI Chief Complaint  Patient presents with  . possible pneumonia    possible pneumonia. been on mucinex 6 pills a day (400mg  each), mucus, night sweats, fever   He is here with complaints of cough, night sweats for past 3 nights with low grade fever. States his lungs sound "crackly" and congested. He is status post surgery for a lumbar issue. Also reports having history of bronchitis.   Denies ear pain, sore throat, rhinorrhea, chest pain, palpitations, shortness of breath, abdominal pain, N/VD He is a former smoker. .  States he has been taking Mucinex to keep his mucous thin since his surgery. Started sudafed and states this has helped his nasal drainage.  States he has been treated for hiccups with Thorazine and is doing better with this but not completely resolved.   Reviewed allergies, medications, past medical, and social history.     Review of Systems Pertinent positives and negatives in the history of present illness.     Objective:   Physical Exam  Constitutional: He is oriented to person, place, and time. He appears well-developed and well-nourished. No distress.  HENT:  Right Ear: Tympanic membrane and ear canal normal.  Left Ear: Tympanic membrane and ear canal normal.  Nose: Nose normal. Right sinus exhibits no maxillary sinus tenderness and no frontal sinus tenderness. Left sinus exhibits no maxillary sinus tenderness and no frontal sinus tenderness.  Mouth/Throat: Uvula is midline, oropharynx is clear and moist and mucous membranes are normal.  Neck: Normal range of motion. Neck supple.  Cardiovascular: Normal rate, regular rhythm and normal heart sounds.   Pulmonary/Chest: Effort normal and breath sounds normal.  Lymphadenopathy:    He has no cervical adenopathy.  Neurological: He is alert and oriented to person, place, and time.  Skin: Skin is warm and dry. No  pallor.  Psychiatric: He has a normal mood and affect. His speech is normal and behavior is normal.   BP 120/80   Pulse 87   Temp 98 F (36.7 C) (Oral)   Wt 210 lb (95.3 kg)   BMI 27.71 kg/m       Assessment & Plan:  Cough  Discussed that he has a history of GI upset with erythromycin but he states he has taken azithromycin in the past multiple times without any problems. He is requesting a prescription to hold onto in case his symptoms worsen over the weekend. Discussed that his exam today is normal. No episodes of coughing during the visit. Will prescribe Z-Pak per patient request and have him follow up with Dr. Susann GivensLalonde or me if needed. Patient was also made aware of unlikely but possible interaction between Thorazine and azithromycin. If he decides to start taking the azithromycin he will stop the Thorazine 24 hours prior

## 2015-11-13 ENCOUNTER — Ambulatory Visit (INDEPENDENT_AMBULATORY_CARE_PROVIDER_SITE_OTHER): Payer: BLUE CROSS/BLUE SHIELD | Admitting: Family Medicine

## 2015-11-13 ENCOUNTER — Encounter: Payer: Self-pay | Admitting: Family Medicine

## 2015-11-13 VITALS — BP 116/70 | HR 77 | Wt 213.0 lb

## 2015-11-13 DIAGNOSIS — M545 Low back pain: Secondary | ICD-10-CM

## 2015-11-13 DIAGNOSIS — F329 Major depressive disorder, single episode, unspecified: Secondary | ICD-10-CM

## 2015-11-13 DIAGNOSIS — Z202 Contact with and (suspected) exposure to infections with a predominantly sexual mode of transmission: Secondary | ICD-10-CM

## 2015-11-13 DIAGNOSIS — G8929 Other chronic pain: Secondary | ICD-10-CM | POA: Diagnosis not present

## 2015-11-13 DIAGNOSIS — Z7251 High risk heterosexual behavior: Secondary | ICD-10-CM | POA: Diagnosis not present

## 2015-11-13 MED ORDER — DULOXETINE HCL 30 MG PO CPEP: 30.0000 mg | ORAL_CAPSULE | Freq: Every day | ORAL | 3 refills | Status: DC

## 2015-11-13 NOTE — Progress Notes (Signed)
   Subjective:    Patient ID: Jeff Hoffman, male    DOB: 07/15/1970, 45 y.o.   MRN: [id number]  HPI He is here for an STD recheck prior to renewing his Truvada. He has had no dysuria, frequency, penile lesions. He also is recovering from recent lumbar surgery and is still having some pain. Also his grandmother died recently which has some quite stressed. He is involved in counseling and does have symptoms of depression.   Review of Systems     Objective:   Physical Exam Alert and in no distress with a despondent look on his face. Properly dressed.       Assessment & Plan:  STD exposure - Plan: GC/Chlamydia Probe Amp, HIV antibody, RPR  Risky sexual behavior - Plan: HIV antibody, RPR  Chronic left-sided low back pain, with sciatica presence unspecified - Plan: DULoxetine (CYMBALTA) 30 MG capsule  Reactive depression - Plan: DULoxetine (CYMBALTA) 30 MG capsule Encouraged him to continue in counseling. Discussed treatment of the depression and his pain. I will try him on Cymbalta to see if we can help with both. He will also continue in counseling. He is to call me in one month and let me know how he is doing.

## 2015-11-14 LAB — HIV ANTIBODY (ROUTINE TESTING W REFLEX): HIV 1&2 Ab, 4th Generation: NONREACTIVE

## 2015-11-14 LAB — GC/CHLAMYDIA PROBE AMP
CT Probe RNA: NOT DETECTED
GC Probe RNA: NOT DETECTED

## 2015-12-17 ENCOUNTER — Other Ambulatory Visit: Payer: Self-pay | Admitting: Family Medicine

## 2015-12-17 NOTE — Telephone Encounter (Signed)
Is this okay to refill? 

## 2016-01-04 ENCOUNTER — Ambulatory Visit (INDEPENDENT_AMBULATORY_CARE_PROVIDER_SITE_OTHER): Payer: BLUE CROSS/BLUE SHIELD | Admitting: Family Medicine

## 2016-01-04 ENCOUNTER — Encounter: Payer: Self-pay | Admitting: Family Medicine

## 2016-01-04 VITALS — BP 122/74 | HR 65 | Temp 98.1°F | Resp 12 | Wt 225.0 lb

## 2016-01-04 DIAGNOSIS — J209 Acute bronchitis, unspecified: Secondary | ICD-10-CM

## 2016-01-04 MED ORDER — AMOXICILLIN 875 MG PO TABS: 875.0000 mg | ORAL_TABLET | Freq: Two times a day (BID) | ORAL | 0 refills | Status: DC

## 2016-01-04 NOTE — Patient Instructions (Signed)
Call if not entirely back to normal when you finish the antibiotic 

## 2016-01-04 NOTE — Progress Notes (Signed)
   Subjective:    Patient ID: Jeff Hoffman, male    DOB: 04/18/1970, 45 y.o.   MRN: [id number]  HPI On December 16 he started having difficulty with rhinorrhea, slight sore throat, nasal congestion and PND but no fever, chills or earache. Discontinued and 2 days ago he developed chest congestion with productive cough. He does not smoke.   Review of Systems     Objective:   Physical Exam Alert and in no distress. Tympanic membranes and canals are normal. Pharyngeal area is normal. Neck is supple without adenopathy or thyromegaly. Cardiac exam shows a regular sinus rhythm without murmurs or gallops. Lungs show minimal rhonchi        Assessment & Plan:  Acute bronchitis, unspecified organism - Plan: amoxicillin (AMOXIL) 875 MG tablet He is to call when he finishes the antibiotic if not totally back to normal.

## 2016-01-13 ENCOUNTER — Other Ambulatory Visit: Payer: Self-pay | Admitting: Family Medicine

## 2016-01-13 DIAGNOSIS — J209 Acute bronchitis, unspecified: Secondary | ICD-10-CM

## 2016-01-14 MED ORDER — AMOXICILLIN 875 MG PO TABS: 875.0000 mg | ORAL_TABLET | Freq: Two times a day (BID) | ORAL | 0 refills | Status: DC

## 2016-02-04 ENCOUNTER — Encounter: Payer: Self-pay | Admitting: Family Medicine

## 2016-02-13 ENCOUNTER — Telehealth: Payer: Self-pay | Admitting: Family Medicine

## 2016-02-13 NOTE — Telephone Encounter (Signed)
Pt said his 2nd round of sickness has now gone into Bronchitis as usual and [facility]

## 2016-02-13 NOTE — Telephone Encounter (Signed)
He called indicating cough, congestion, rhinorrhea. Everything has improved except for the chest congestion. He does have a previous history of difficulty with asthma and is using an inhaler. I will give him Flovent to be used temporarily. He will let me know how this works.

## 2016-02-14 ENCOUNTER — Other Ambulatory Visit: Payer: Self-pay | Admitting: Family Medicine

## 2016-02-14 NOTE — Telephone Encounter (Signed)
Is this okay to refill? 

## 2016-02-14 NOTE — Telephone Encounter (Signed)
Have him come in for nurse visit HIV

## 2016-02-14 NOTE — Telephone Encounter (Signed)
Can you please put lab order in?

## 2016-02-21 ENCOUNTER — Other Ambulatory Visit: Payer: BLUE CROSS/BLUE SHIELD

## 2016-02-21 DIAGNOSIS — Z7251 High risk heterosexual behavior: Secondary | ICD-10-CM

## 2016-02-21 LAB — HIV ANTIBODY (ROUTINE TESTING W REFLEX): HIV: NONREACTIVE

## 2016-03-17 ENCOUNTER — Encounter: Payer: Self-pay | Admitting: Family Medicine

## 2016-03-18 ENCOUNTER — Other Ambulatory Visit: Payer: Self-pay | Admitting: Family Medicine

## 2016-03-18 NOTE — Telephone Encounter (Signed)
Is this okay to refill? 

## 2016-03-24 ENCOUNTER — Ambulatory Visit (INDEPENDENT_AMBULATORY_CARE_PROVIDER_SITE_OTHER): Payer: BLUE CROSS/BLUE SHIELD | Admitting: Family Medicine

## 2016-03-24 VITALS — BP 110/70 | HR 64 | Resp 16 | Ht 71.0 in | Wt 218.8 lb

## 2016-03-24 DIAGNOSIS — L739 Follicular disorder, unspecified: Secondary | ICD-10-CM

## 2016-03-24 MED ORDER — DOXYCYCLINE HYCLATE 100 MG PO TABS: 100.0000 mg | ORAL_TABLET | Freq: Two times a day (BID) | ORAL | 0 refills | Status: DC

## 2016-03-24 NOTE — Progress Notes (Signed)
   Subjective:    Patient ID: Jeff Hoffman, male    DOB: 12/09/1970, 46 y.o.   MRN: [id number]  HPI He has had difficulty with a rash in the left axilla for the last month. He has been using cortisone cream with intermittent success on this.   Review of Systems     Objective:   Physical Exam Follicular erythematous lesions noted in the left axilla but not the right. No other rash noted.       Assessment & Plan:  Folliculitis - Plan: doxycycline (VIBRA-TABS) 100 MG tablet  I will treat this for a week and if no improvement, further evaluation will be needed.

## 2016-03-31 ENCOUNTER — Encounter: Payer: Self-pay | Admitting: Family Medicine

## 2016-04-21 ENCOUNTER — Other Ambulatory Visit: Payer: Self-pay | Admitting: Family Medicine

## 2016-04-21 NOTE — Telephone Encounter (Signed)
Is this okay to refill? 

## 2016-05-21 ENCOUNTER — Other Ambulatory Visit: Payer: Self-pay | Admitting: Family Medicine

## 2016-05-21 NOTE — Telephone Encounter (Signed)
Is this okay to refill? 

## 2016-05-24 ENCOUNTER — Telehealth: Payer: Self-pay | Admitting: Family Medicine

## 2016-05-24 NOTE — Telephone Encounter (Signed)
P.A. QVAR 

## 2016-05-28 ENCOUNTER — Other Ambulatory Visit: Payer: Self-pay | Admitting: Family Medicine

## 2016-05-28 NOTE — Telephone Encounter (Signed)
Okay but he needs to come in for blood work and to see me

## 2016-05-28 NOTE — Telephone Encounter (Signed)
Is this okay to refill? 

## 2016-05-29 ENCOUNTER — Telehealth: Payer: Self-pay | Admitting: Internal Medicine

## 2016-05-29 ENCOUNTER — Other Ambulatory Visit: Payer: BLUE CROSS/BLUE SHIELD

## 2016-05-29 DIAGNOSIS — Z7251 High risk heterosexual behavior: Secondary | ICD-10-CM

## 2016-05-29 MED ORDER — FLUTICASONE PROPIONATE HFA 44 MCG/ACT IN AERO: 2.0000 | INHALATION_SPRAY | Freq: Two times a day (BID) | RESPIRATORY_TRACT | 12 refills | Status: DC

## 2016-05-29 NOTE — Telephone Encounter (Signed)
Pt came in today and he states that his qvar is very expensive and wants to be switched to flovent. Please send in flovent to pharmacy

## 2016-05-30 LAB — HIV ANTIBODY (ROUTINE TESTING W REFLEX): HIV: NONREACTIVE

## 2016-05-30 NOTE — Telephone Encounter (Signed)
P.A. Approved til 05/25/17, pt informed.  Pt states he wants to switch to Flovent due to cost.  He has sent message to Dr. Susann GivensLalonde

## 2016-06-27 ENCOUNTER — Other Ambulatory Visit: Payer: Self-pay | Admitting: Family Medicine

## 2016-06-27 NOTE — Telephone Encounter (Signed)
This okay to refill? 

## 2016-07-24 ENCOUNTER — Ambulatory Visit (INDEPENDENT_AMBULATORY_CARE_PROVIDER_SITE_OTHER): Payer: BLUE CROSS/BLUE SHIELD | Admitting: Family Medicine

## 2016-07-24 ENCOUNTER — Encounter: Payer: Self-pay | Admitting: Family Medicine

## 2016-07-24 VITALS — BP 120/70 | HR 76 | Wt 216.0 lb

## 2016-07-24 DIAGNOSIS — R609 Edema, unspecified: Secondary | ICD-10-CM

## 2016-07-24 NOTE — Progress Notes (Signed)
   Subjective:    Patient ID: Jeff Hoffman, male    DOB: 11/22/1970, 46 y.o.   MRN: [id number]  HPI He complains of a one-day history of difficulty with lower extremity swelling. He states he noted this last night. We will cup this morning he had less swelling and is a day progressed, noted more. He does sit a lot at work but no more than normal. He's had no chest pain, shortness of breath, PND, DOE. No change in his medications. Review of his record does indicate a previous echocardiogram which was essentially normal.   Review of Systems     Objective:   Physical Exam Alert and in no distress. Cardiac exam shows regular rhythm without murmurs or gallops. Lower extremity exam does show 1-2+ pitting edema bilaterally with good pulses. Reflexes normal. Negative Homans sign.       Assessment & Plan:  Dependent edema Discussed the swelling down there and at this point do not think this is cardiac or renal in origin. Recommend supportive care with elevating feet as much as possible and using support hose. He will keep in touch with me and I will reevaluate this at a later date as needed.

## 2016-08-11 ENCOUNTER — Other Ambulatory Visit: Payer: Self-pay | Admitting: Family Medicine

## 2016-08-11 ENCOUNTER — Encounter: Payer: Self-pay | Admitting: Family Medicine

## 2016-08-11 ENCOUNTER — Telehealth: Payer: Self-pay | Admitting: Family Medicine

## 2016-08-11 ENCOUNTER — Ambulatory Visit (INDEPENDENT_AMBULATORY_CARE_PROVIDER_SITE_OTHER): Payer: BLUE CROSS/BLUE SHIELD | Admitting: Family Medicine

## 2016-08-11 VITALS — BP 112/60 | HR 76 | Wt 213.0 lb

## 2016-08-11 DIAGNOSIS — R609 Edema, unspecified: Secondary | ICD-10-CM

## 2016-08-11 DIAGNOSIS — H547 Unspecified visual loss: Secondary | ICD-10-CM | POA: Diagnosis not present

## 2016-08-11 LAB — CBC WITH DIFFERENTIAL/PLATELET
BASOS PCT: 1 %
Basophils Absolute: 50 cells/uL (ref 0–200)
Eosinophils Absolute: 50 cells/uL (ref 15–500)
Eosinophils Relative: 1 %
HCT: 35.8 % — ABNORMAL LOW (ref 38.5–50.0)
Hemoglobin: 11.4 g/dL — ABNORMAL LOW (ref 13.2–17.1)
LYMPHS PCT: 19 %
Lymphs Abs: 950 cells/uL (ref 850–3900)
MCH: 25.3 pg — ABNORMAL LOW (ref 27.0–33.0)
MCHC: 31.8 g/dL — AB (ref 32.0–36.0)
MCV: 79.4 fL — ABNORMAL LOW (ref 80.0–100.0)
MPV: 9.5 fL (ref 7.5–12.5)
Monocytes Absolute: 300 cells/uL (ref 200–950)
Monocytes Relative: 6 %
NEUTROS PCT: 73 %
Neutro Abs: 3650 cells/uL (ref 1500–7800)
Platelets: 179 10*3/uL (ref 140–400)
RBC: 4.51 MIL/uL (ref 4.20–5.80)
RDW: 16.7 % — AB (ref 11.0–15.0)
WBC: 5 10*3/uL (ref 4.0–10.5)

## 2016-08-11 LAB — COMPREHENSIVE METABOLIC PANEL
ALK PHOS: 82 U/L (ref 40–115)
ALT: 13 U/L (ref 9–46)
AST: 17 U/L (ref 10–40)
Albumin: 4.4 g/dL (ref 3.6–5.1)
BILIRUBIN TOTAL: 0.5 mg/dL (ref 0.2–1.2)
BUN: 17 mg/dL (ref 7–25)
CO2: 25 mmol/L (ref 20–32)
Calcium: 9.4 mg/dL (ref 8.6–10.3)
Chloride: 101 mmol/L (ref 98–110)
Creat: 1.15 mg/dL (ref 0.60–1.35)
GLUCOSE: 83 mg/dL (ref 65–99)
Potassium: 5 mmol/L (ref 3.5–5.3)
Sodium: 138 mmol/L (ref 135–146)
Total Protein: 6.7 g/dL (ref 6.1–8.1)

## 2016-08-11 NOTE — Telephone Encounter (Signed)
Pt said specialist doctor will not need blood work so he will not need any extra labs from blood that was drawn today.

## 2016-08-11 NOTE — Progress Notes (Signed)
   Subjective:    Patient ID: Jeff Hoffman, male    DOB: 08/27/1970, 46 y.o.   MRN: [id number]  HPI He is again having difficulty with lower leg edema especially later on in the day. He has had no chest pain, shortness of breath, DOE, PND, urinary issues, skin or hair changes. He has tried an OTC diuretic. He also complains of some swelling in his right hand over the dorsum of it however this is now diminished. Apparently over the weekend he also noted visual changes with loss followed by the appearance of broken glass that lasted several hours. He has an appointment to see an ophthalmologist.   Review of Systems     Objective:   Physical Exam Alert and in no distress. EOMI Tympanic membranes and canals are normal. Pharyngeal area is normal. Neck is supple without adenopathy or thyromegaly. Cardiac exam shows a regular sinus rhythm without murmurs or gallops. Lungs are clear to auscultation.      Assessment & Plan:  Dependent edema - Plan: CBC with Differential/Platelet, Comprehensive metabolic panel, Sedimentation rate  Visual loss - Plan: CBC with Differential/Platelet, Comprehensive metabolic panel, Sedimentation rate He now has new symptoms. Worthwhile looking into this a little more thoroughly.

## 2016-08-15 ENCOUNTER — Other Ambulatory Visit: Payer: Self-pay | Admitting: Family Medicine

## 2016-08-15 NOTE — Telephone Encounter (Signed)
Is this ok to refill?  

## 2016-08-21 ENCOUNTER — Encounter: Payer: Self-pay | Admitting: Medical

## 2016-08-21 ENCOUNTER — Ambulatory Visit (INDEPENDENT_AMBULATORY_CARE_PROVIDER_SITE_OTHER): Payer: BLUE CROSS/BLUE SHIELD | Admitting: Medical

## 2016-08-21 VITALS — BP 114/70 | HR 63 | Wt 215.2 lb

## 2016-08-21 DIAGNOSIS — Z23 Encounter for immunization: Secondary | ICD-10-CM

## 2016-08-21 DIAGNOSIS — K6289 Other specified diseases of anus and rectum: Secondary | ICD-10-CM | POA: Diagnosis not present

## 2016-08-21 DIAGNOSIS — Z113 Encounter for screening for infections with a predominantly sexual mode of transmission: Secondary | ICD-10-CM

## 2016-08-21 DIAGNOSIS — R195 Other fecal abnormalities: Secondary | ICD-10-CM

## 2016-08-21 DIAGNOSIS — Z299 Encounter for prophylactic measures, unspecified: Secondary | ICD-10-CM | POA: Diagnosis not present

## 2016-08-21 DIAGNOSIS — Z7251 High risk heterosexual behavior: Secondary | ICD-10-CM

## 2016-08-21 MED ORDER — CEFTRIAXONE SODIUM 250 MG IJ SOLR
250.0000 mg | Freq: Once | INTRAMUSCULAR | Status: AC
  Administered 2016-08-21: 250 mg via INTRAMUSCULAR

## 2016-08-21 MED ORDER — DOXYCYCLINE HYCLATE 100 MG PO TABS: 100.0000 mg | ORAL_TABLET | Freq: Two times a day (BID) | ORAL | 0 refills | Status: DC

## 2016-08-21 NOTE — Progress Notes (Signed)
Subjective: Chief Complaint  Patient presents with  . possible std    possible std in the renal , mucus, gas, started x1 day ago i   Here for concern for STD.  He notes 2 days of bloating, gassy, fullness in rectal, loose stools.   He had this before related to STD.  Had unprotected sex, receptive 12 days ago.  He is taking Truvada Prep, compliant with this since 2012.  Denies genital rash, urinary frequency, no penile discharge.  No fever, no fever, no body aches, no chills, no nausea, no vomiting.   Not using condoms though.  No other aggravating or relieving factors. No other complaint.   Past Medical History:  Diagnosis Date  . Allergy   . Asthma   . GERD (gastroesophageal reflux disease)   . Hypogonadism male    Current Outpatient Prescriptions on File Prior to Visit  Medication Sig Dispense Refill  . Albuterol Sulfate (PROAIR RESPICLICK) 108 (90 BASE) MCG/ACT AEPB Inhale 2 puffs into the lungs 4 (four) times daily as needed. (Patient taking differently: Inhale 2 puffs into the lungs 4 (four) times daily as needed (for shortness of breath). ) 1 each 1  . Ascorbic Acid (VITAMIN C PO) Take 1 tablet by mouth daily.    Marland Kitchen aspirin EC 81 MG tablet Take 81 mg by mouth daily.    Marland Kitchen azelastine (ASTELIN) 0.1 % nasal spray USE 1 SPRAY IN EACH NOSTRIL TWICE A DAY. 30 mL 11  . CIALIS 5 MG tablet TAKE 1 TABLET ONCE DAILY AS NEEDED FOR ED. 30 tablet 1  . Fexofenadine HCl (ALLEGRA PO) Take 1 tablet by mouth daily.    . fluticasone (FLONASE) 50 MCG/ACT nasal spray Place 2 sprays into both nostrils daily. (Patient taking differently: Place 2 sprays into both nostrils 2 (two) times daily. ) 16 g 11  . hydrocortisone cream 0.5 % Apply 1 application topically daily as needed for itching.    Marland Kitchen ibuprofen (ADVIL,MOTRIN) 200 MG tablet Take 200 mg by mouth every 6 (six) hours as needed for headache or moderate pain. Reported on 06/13/2015    . Magnesium 200 MG TABS Take 200 mg by mouth daily.     . montelukast  (SINGULAIR) 10 MG tablet TAKE 1 TABLET ONCE DAILY. 90 tablet 3  . NON FORMULARY Pre- work out citadel t-1  creapure 5g,carnosyn 3.2g,L-tyrosine 3g,caffeine 200mg  3 x week before workout    . NON FORMULARY Branch- Chain Amino Acid 2 pills QD    . Omega-3 Fatty Acids (FISH OIL) 1000 MG CAPS Take 1,000 mg by mouth daily.     Marland Kitchen omeprazole (PRILOSEC) 40 MG capsule TAKE 1 CAPSULE DAILY. 90 capsule 3  . polycarbophil (FIBERCON) 625 MG tablet Take 625 mg by mouth daily.    . pseudoephedrine (SUDAFED) 60 MG tablet Take 60 mg by mouth every 4 (four) hours as needed for congestion.    . TRUVADA 200-300 MG tablet TAKE 1 TABLET DAILY. 30 tablet 2  . naproxen sodium (ANAPROX) 220 MG tablet Take 220 mg by mouth 2 (two) times daily with a meal.    . [DISCONTINUED] Testosterone 20.25 MG/ACT (1.62%) GEL Place 3 Squirts onto the skin daily.       No current facility-administered medications on file prior to visit.    ROS as in subjective   Objective: BP 114/70   Pulse 63   Wt 215 lb 3.2 oz (97.6 kg)   SpO2 97%   BMI 30.01 kg/m   General  appearance: alert, no distress, WD/WN,  Abdomen: +bs, soft, non tender, non distended, no masses, no hepatomegaly, no splenomegaly Rectal: anus normal appearing, no obvious erythema or pus, small left sided 3mm raised papular appearing skin tag or other small lump unchanged per patient   Assessment: Encounter Diagnoses  Name Primary?  . Proctitis Yes  . Loose stools   . Screen for STD (sexually transmitted disease)   . Prophylactic measure   . High risk sexual behavior   . Need for Td vaccine    Plan: Proctitis, loose stools - will treat empirically for gonorrhea and chlamydia with IM Rocephin 250mg  in office today, oral doxycycline sent to pharmacy.   Advised condom use.  C/t Prep, labs today including rectal gonorrhea and chlamydia swabs.    Counseled on the Td (tetanus, diptheria) vaccine.  Vaccine information sheet given. Td vaccine given after consent  obtained.  Carollee HerterShannon was seen today for possible std.  Diagnoses and all orders for this visit:  Proctitis -     HIV antibody -     RPR -     GC/Chlamydia Probe Amp  Loose stools  Screen for STD (sexually transmitted disease) -     HIV antibody -     RPR -     GC/Chlamydia Probe Amp  Prophylactic measure  High risk sexual behavior -     HIV antibody -     RPR -     GC/Chlamydia Probe Amp  Need for Td vaccine  Other orders -     doxycycline (VIBRA-TABS) 100 MG tablet; Take 1 tablet (100 mg total) by mouth 2 (two) times daily.

## 2016-08-22 ENCOUNTER — Encounter: Payer: Self-pay | Admitting: Medical

## 2016-08-22 LAB — GC/CHLAMYDIA PROBE AMP
CT PROBE, AMP APTIMA: NOT DETECTED
GC Probe RNA: NOT DETECTED

## 2016-08-22 LAB — RPR

## 2016-08-22 LAB — HIV ANTIBODY (ROUTINE TESTING W REFLEX): HIV 1&2 Ab, 4th Generation: NONREACTIVE

## 2016-09-22 ENCOUNTER — Encounter: Payer: Self-pay | Admitting: Family Medicine

## 2016-09-22 ENCOUNTER — Ambulatory Visit (INDEPENDENT_AMBULATORY_CARE_PROVIDER_SITE_OTHER): Payer: BLUE CROSS/BLUE SHIELD | Admitting: Family Medicine

## 2016-09-22 VITALS — BP 120/60 | HR 76 | Resp 16 | Wt 222.0 lb

## 2016-09-22 DIAGNOSIS — J3489 Other specified disorders of nose and nasal sinuses: Secondary | ICD-10-CM | POA: Diagnosis not present

## 2016-09-22 DIAGNOSIS — K629 Disease of anus and rectum, unspecified: Secondary | ICD-10-CM

## 2016-09-22 DIAGNOSIS — R2231 Localized swelling, mass and lump, right upper limb: Secondary | ICD-10-CM | POA: Diagnosis not present

## 2016-09-22 MED ORDER — LIDOCAINE HCL (PF) 2 % IJ SOLN
2.0000 mL | Freq: Once | INTRAMUSCULAR | Status: AC
  Administered 2016-09-22: 2 mL

## 2016-09-22 NOTE — Addendum Note (Signed)
Addended by: Minette Headland L on: 09/22/2016 05:04 PM   Modules accepted: Orders

## 2016-09-22 NOTE — Progress Notes (Signed)
   Subjective:    Patient ID: Jeff Hoffman, male    DOB: 1970-02-13, 46 y.o.   MRN: [id number]  HPI He is here for multiple issues. For the last 4 weeks he has had difficulty with rhinorrhea and occasional coughing. In the last several days the cough has become a little bit more prominent but no fever, chills, sore throat or earache. No underlying allergies. He also has a lesion on his anal area that he would like further evaluated. He continues have difficulty with his right hand stating he notes more swelling in the hand in the morning and also decreased manual dexterity but as the day progresses this tends to go away. He does not complain of numbness or tingling.   Review of Systems     Objective:   Physical Exam Alert and in no distress. Tympanic membranes and canals are normal. Pharyngeal area is normal. Neck is supple without adenopathy or thyromegaly. Cardiac exam shows a regular sinus rhythm without murmurs or gallops. Lungs are clear to auscultation. Exam of the hand shows no swelling. Joints are normal with full motion. Tinel's test was questionable positive however Phalen test negative. Anal exam does show a small cystic lesion present at 4:00 position.       Assessment & Plan:  Anal lesion  Localized swelling on right hand  Rhinorrhea Anal lesion was injected with Xylocaine and hyfrecated without difficulty. No therapy for the rhinorrhea and occasional cough. Explained that I did not think an antibiotic would be appropriate was comfortable with that. Discussed the right hand symptoms. Recommend he try using a splint at night to see if this makes a difference. I explained that at this point the symptoms are not really diagnostic. He will let me know how the splinting works.

## 2016-09-26 ENCOUNTER — Other Ambulatory Visit: Payer: Self-pay | Admitting: Family Medicine

## 2016-10-13 ENCOUNTER — Other Ambulatory Visit: Payer: Self-pay | Admitting: Family Medicine

## 2016-10-13 NOTE — Telephone Encounter (Signed)
Is this okay to refill? 

## 2016-10-17 ENCOUNTER — Other Ambulatory Visit: Payer: Self-pay | Admitting: Family Medicine

## 2016-10-27 ENCOUNTER — Other Ambulatory Visit: Payer: Self-pay | Admitting: Family Medicine

## 2016-10-27 ENCOUNTER — Other Ambulatory Visit (INDEPENDENT_AMBULATORY_CARE_PROVIDER_SITE_OTHER): Payer: BLUE CROSS/BLUE SHIELD

## 2016-10-27 ENCOUNTER — Encounter: Payer: Self-pay | Admitting: Family Medicine

## 2016-10-27 DIAGNOSIS — Z23 Encounter for immunization: Secondary | ICD-10-CM | POA: Diagnosis not present

## 2016-10-27 MED ORDER — DOXYCYCLINE HYCLATE 100 MG PO TABS: 100.0000 mg | ORAL_TABLET | Freq: Two times a day (BID) | ORAL | 0 refills | Status: DC

## 2016-10-30 IMAGING — RF DG LUMBAR SPINE 2-3V
1 series · 2 of 2 positions shown · non-contrast
Comparison: Lumbar spine MRI 08/11/2014

CLINICAL DATA: L4-5 and L5-S1 PLIF.

EXAM:
DG C-ARM 61-120 MIN; LUMBAR SPINE - 2-3 VIEW

[Series 1: run · 2 of 2 slices shown]
[im 1/2]
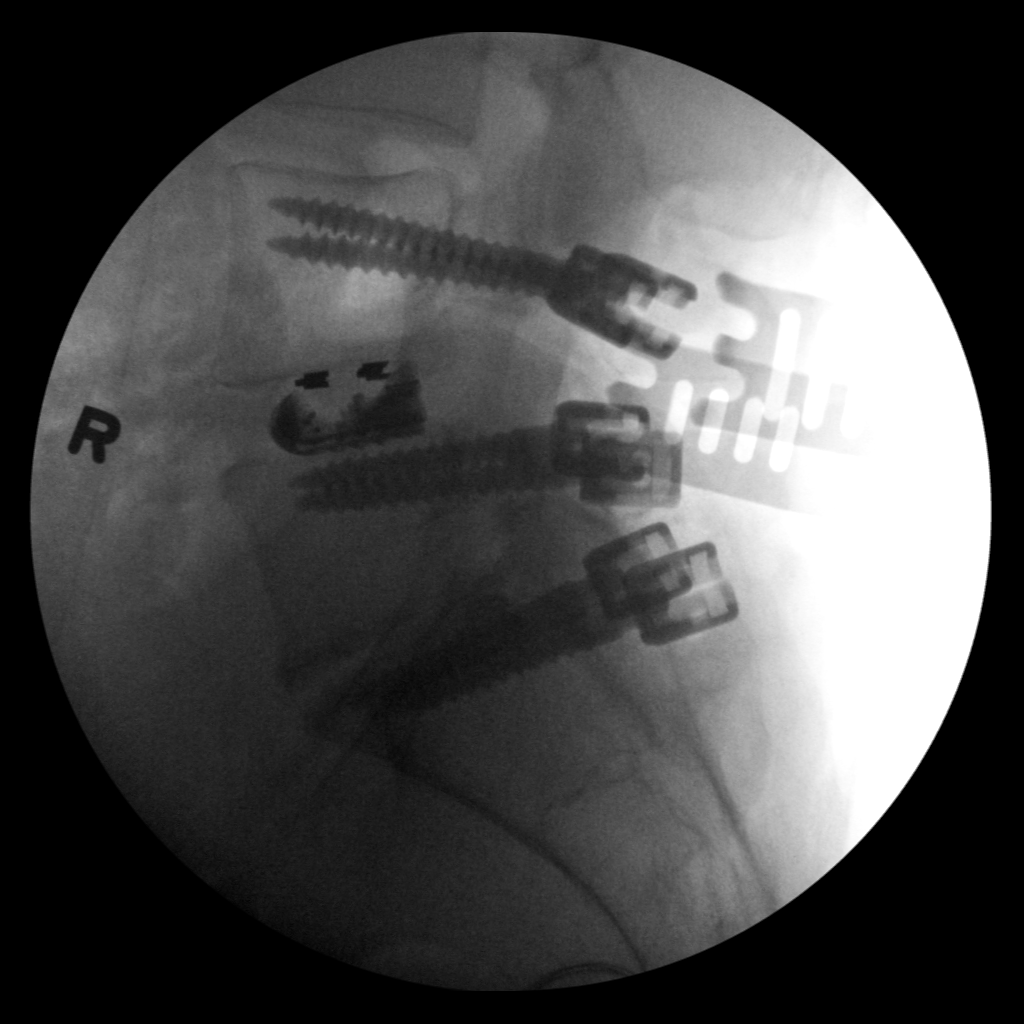
[im 2/2]
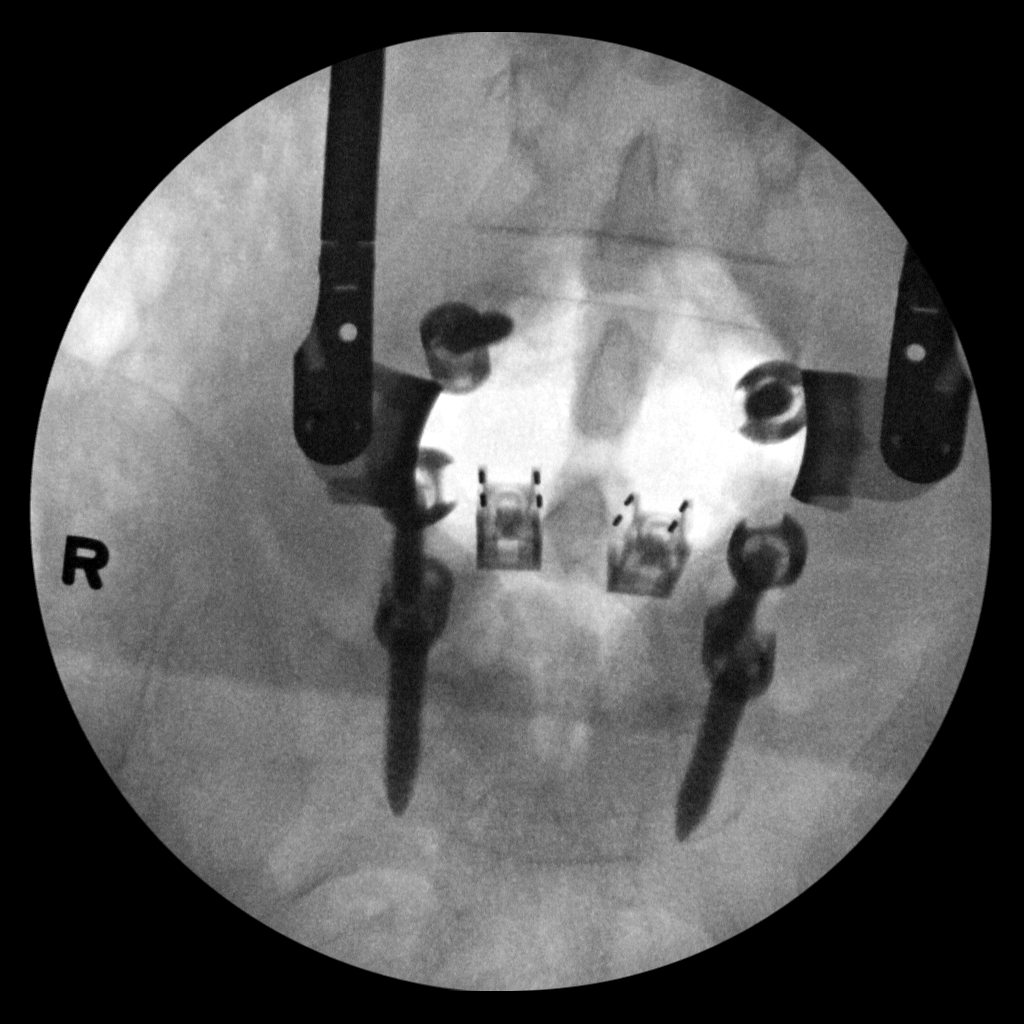

[2 of 2 positions shown; findings below may reference images not displayed]

FINDINGS: Pedicle screws placed at L4, L5, and S1. Intervertebral cage present
at L4-5, in unremarkable position. No evidence of intraoperative
fracture.
IMPRESSION: Fluoroscopy for L4-5 and L5-S1 fusion.  No unexpected finding.

## 2016-11-11 ENCOUNTER — Other Ambulatory Visit: Payer: Self-pay | Admitting: Family Medicine

## 2016-12-08 ENCOUNTER — Encounter: Payer: Self-pay | Admitting: Family Medicine

## 2016-12-09 ENCOUNTER — Encounter: Payer: Self-pay | Admitting: Family Medicine

## 2016-12-09 ENCOUNTER — Ambulatory Visit (INDEPENDENT_AMBULATORY_CARE_PROVIDER_SITE_OTHER): Payer: BLUE CROSS/BLUE SHIELD | Admitting: Family Medicine

## 2016-12-09 VITALS — BP 128/70 | HR 76 | Temp 98.1°F | Resp 18 | Wt 218.2 lb

## 2016-12-09 DIAGNOSIS — R197 Diarrhea, unspecified: Secondary | ICD-10-CM

## 2016-12-09 DIAGNOSIS — Z113 Encounter for screening for infections with a predominantly sexual mode of transmission: Secondary | ICD-10-CM | POA: Diagnosis not present

## 2016-12-09 NOTE — Progress Notes (Signed)
   Subjective:    Patient ID: Jeff Hoffman, male    DOB: 10/22/1970, 46 y.o.   MRN: [id number]  HPI He is here for evaluation of difficulty with diarrhea.  It started around November 29 and a couple of days later he started having difficulty with fever, chills, abdominal gas but no rectal discomfort, discharge or blood.  He has been having unprotected anal sex.  He states that the symptoms are different than previous anal STDs that he has had .  [place] has [place] in [place] during that timeframe.  At his request I placed him on doxycycline in an effort to try and prevent STDs while he was involved in unprotected sex.   Review of Systems     Objective:   Physical Exam Alert and in no distress.  Abdominal exam shows active bowel sounds without masses or tenderness.  Rectal exam showed no obvious lesions.  GC chlamydia culture taken.      Assessment & Plan:  Diarrhea, unspecified type  Screen for STD (sexually transmitted disease) - Plan: C. trachomatis/N. gonorrhoeae RNA  Also stool for O + P as well as culture and C. difficile toxin was ordered.  I explained that under the circumstances I did not feel comfortable treating him empirically for any particular disease as there are plenty of options.

## 2016-12-10 LAB — C. TRACHOMATIS/N. GONORRHOEAE RNA
C. trachomatis RNA, TMA: NOT DETECTED
N. gonorrhoeae RNA, TMA: NOT DETECTED

## 2016-12-11 ENCOUNTER — Other Ambulatory Visit: Payer: Self-pay | Admitting: Family Medicine

## 2016-12-11 ENCOUNTER — Telehealth: Payer: Self-pay | Admitting: Family Medicine

## 2016-12-11 NOTE — Telephone Encounter (Signed)
Pt called stating that he dropped off his sample as instructed and was told that results would not be available until this weekend. Pt is concerned about waiting that long without treatment. Can he not be treated proactively?

## 2016-12-11 NOTE — Telephone Encounter (Signed)
Explain to him that we need to wait on the culture results to know the best way to treat.

## 2016-12-11 NOTE — Telephone Encounter (Signed)
Spoke with pt- he expressed that he didn't feel comfortable waiting. Advised pt if anything is abnormal then they will contact the office to make JCL aware. Pt verbalized understanding. Trixie Rude/RLB

## 2016-12-11 NOTE — Telephone Encounter (Signed)
See msg

## 2016-12-12 ENCOUNTER — Other Ambulatory Visit: Payer: Self-pay | Admitting: Family Medicine

## 2016-12-13 LAB — C. DIFFICILE GDH AND TOXIN A/B
GDH ANTIGEN: NOT DETECTED
MICRO NUMBER:: 81379338
SPECIMEN QUALITY:: ADEQUATE
TOXIN A AND B: NOT DETECTED

## 2016-12-15 ENCOUNTER — Encounter: Payer: Self-pay | Admitting: Family Medicine

## 2016-12-17 LAB — C. DIFFICILE GDH AND TOXIN A/B

## 2016-12-17 LAB — STOOL CULTURE
MICRO NUMBER: 81373408
MICRO NUMBER: 81373409
MICRO NUMBER: 81373406
MICRO NUMBER:: 81373407
MICRO NUMBER:: 81373404
MICRO NUMBER:: 81373405
SHIGA RESULT: NOT DETECTED
SHIGA RESULT:: NOT DETECTED
SPECIMEN QUALITY: ADEQUATE
SPECIMEN QUALITY: ADEQUATE
SPECIMEN QUALITY:: ADEQUATE
SPECIMEN QUALITY:: ADEQUATE
SPECIMEN QUALITY:: ADEQUATE
SPECIMEN QUALITY:: ADEQUATE

## 2016-12-17 LAB — OVA AND PARASITE EXAMINATION
CONCENTRATE RESULT: NONE SEEN
TRICHROME RESULT: NONE SEEN

## 2016-12-18 ENCOUNTER — Encounter: Payer: Self-pay | Admitting: Family Medicine

## 2016-12-18 MED ORDER — METRONIDAZOLE 500 MG PO TABS: 500.0000 mg | ORAL_TABLET | Freq: Three times a day (TID) | ORAL | 0 refills | Status: DC

## 2016-12-18 NOTE — Addendum Note (Signed)
Addended by: Ronnald NianLALONDE, Namish Krise C on: 12/18/2016 02:18 PM   Modules accepted: Orders

## 2016-12-24 ENCOUNTER — Other Ambulatory Visit: Payer: Self-pay | Admitting: Family Medicine

## 2016-12-25 ENCOUNTER — Other Ambulatory Visit: Payer: Self-pay | Admitting: Family Medicine

## 2016-12-25 NOTE — Telephone Encounter (Signed)
Ok to renew?  

## 2017-01-23 ENCOUNTER — Ambulatory Visit: Payer: Self-pay | Admitting: Family Medicine

## 2017-01-26 ENCOUNTER — Other Ambulatory Visit: Payer: Self-pay | Admitting: *Deleted

## 2017-01-26 DIAGNOSIS — Z299 Encounter for prophylactic measures, unspecified: Secondary | ICD-10-CM

## 2017-01-27 ENCOUNTER — Other Ambulatory Visit (INDEPENDENT_AMBULATORY_CARE_PROVIDER_SITE_OTHER): Payer: BLUE CROSS/BLUE SHIELD

## 2017-01-27 DIAGNOSIS — Z299 Encounter for prophylactic measures, unspecified: Secondary | ICD-10-CM

## 2017-01-28 LAB — HIV ANTIBODY (ROUTINE TESTING W REFLEX): HIV SCREEN 4TH GENERATION: NONREACTIVE

## 2017-02-03 ENCOUNTER — Other Ambulatory Visit: Payer: Self-pay | Admitting: Family Medicine

## 2017-02-03 NOTE — Telephone Encounter (Signed)
Is this okay to refill? 

## 2017-02-27 ENCOUNTER — Ambulatory Visit (INDEPENDENT_AMBULATORY_CARE_PROVIDER_SITE_OTHER): Payer: BLUE CROSS/BLUE SHIELD | Admitting: Family Medicine

## 2017-02-27 ENCOUNTER — Encounter: Payer: Self-pay | Admitting: Family Medicine

## 2017-02-27 VITALS — BP 112/68 | HR 76 | Temp 98.5°F | Wt 214.0 lb

## 2017-02-27 DIAGNOSIS — J209 Acute bronchitis, unspecified: Secondary | ICD-10-CM

## 2017-02-27 MED ORDER — AZITHROMYCIN 500 MG PO TABS: 500.0000 mg | ORAL_TABLET | Freq: Every day | ORAL | 0 refills | Status: DC

## 2017-02-27 NOTE — Progress Notes (Signed)
   Subjective:    Patient ID: Jeff Hoffman, male    DOB: 05/13/1970, 47 y.o.   MRN: [id number]  HPI He is here for consult concerning difficulty with rhinorrhea that started on February 10.  He then had difficulty with diarrhea starting on the 14th for 5 days which has cleared up.  3 days ago he noted sore throat, ear congestion, cough that has become productive, fatigue and malaise.  No fever, chills, sore throat.  He states that usually when he gets this point, an antibiotic as needed.   Review of Systems     Objective:   Physical Exam Alert and in no distress. Tympanic membranes and canals are normal. Pharyngeal area is normal. Neck is supple without adenopathy or thyromegaly. Cardiac exam shows a regular sinus rhythm without murmurs or gallops. Lungs are clear to auscultation.        Assessment & Plan:  Acute bronchitis, unspecified organism - Plan: azithromycin (ZITHROMAX) 500 MG tablet He will call if not entirely better in 1 week.

## 2017-03-06 ENCOUNTER — Encounter: Payer: Self-pay | Admitting: Family Medicine

## 2017-03-06 MED ORDER — LEVOFLOXACIN 500 MG PO TABS: 500.0000 mg | ORAL_TABLET | Freq: Every day | ORAL | 0 refills | Status: DC

## 2017-03-17 ENCOUNTER — Telehealth: Payer: Self-pay | Admitting: Family Medicine

## 2017-03-17 MED ORDER — LEVOFLOXACIN 500 MG PO TABS: 500.0000 mg | ORAL_TABLET | Freq: Every day | ORAL | 0 refills | Status: DC

## 2017-03-17 NOTE — Telephone Encounter (Signed)
He is roughly 85% better.  I will therefore give him another dose of Levaquin.

## 2017-03-17 NOTE — Telephone Encounter (Signed)
Pt still has some head and chest congestion, and still coughing up a little phlegm. He has completed the 10 days of antibiotic. Should the meds still be working or does he need more meds? Either send pt a MyChart message or call him

## 2017-03-25 ENCOUNTER — Other Ambulatory Visit: Payer: Self-pay | Admitting: Family Medicine

## 2017-03-25 NOTE — Telephone Encounter (Signed)
Is this okay to refill? 

## 2017-04-01 ENCOUNTER — Encounter: Payer: Self-pay | Admitting: Family Medicine

## 2017-04-01 ENCOUNTER — Ambulatory Visit (INDEPENDENT_AMBULATORY_CARE_PROVIDER_SITE_OTHER): Payer: BLUE CROSS/BLUE SHIELD | Admitting: Family Medicine

## 2017-04-01 VITALS — BP 130/78 | HR 81 | Temp 98.1°F | Resp 16 | Wt 218.8 lb

## 2017-04-01 DIAGNOSIS — T7840XA Allergy, unspecified, initial encounter: Secondary | ICD-10-CM

## 2017-04-01 MED ORDER — PREDNISONE 10 MG (21) PO TBPK
ORAL_TABLET | Freq: Every day | ORAL | 0 refills | Status: DC
Start: 2017-04-01 — End: 2017-10-28

## 2017-04-01 NOTE — Progress Notes (Signed)
   Subjective:    Patient ID: Jeff Hoffman, male    DOB: 04/07/1970, 47 y.o.   MRN: [id number]  HPI Chief Complaint  Patient presents with  . possible reaction- rash    possible allergic reaction- rash. not have been doing anything different., taking otc meds for allergy   He is here with complaints of of a 3 day history of watery eyes and swelling under both eyes. States he was in a new office building the day his symptoms began.   He has underlying allergies and is treating these with oral antihistamines and Flonase.   Denies fever, chills, dizziness, rhinorrhea, ear pain, nasal congestion, sore throat, chest pain, palpitations, shortness of breath, cough, wheezing, abdominal pain, N/V/D.   Reviewed allergies, medications, past medical, surgical, family, and social history.    Review of Systems Pertinent positives and negatives in the history of present illness.     Objective:   Physical Exam  Constitutional: He appears well-developed and well-nourished. No distress.  HENT:  Head:    Right Ear: Tympanic membrane and ear canal normal.  Left Ear: Tympanic membrane and ear canal normal.  Nose: Mucosal edema and rhinorrhea present. Right sinus exhibits no maxillary sinus tenderness and no frontal sinus tenderness. Left sinus exhibits no maxillary sinus tenderness and no frontal sinus tenderness.  Mouth/Throat: Uvula is midline, oropharynx is clear and moist and mucous membranes are normal.  Swelling beneath bilateral lower lids  Eyes: Pupils are equal, round, and reactive to light. Conjunctivae and lids are normal.  Neck: Neck supple.  Cardiovascular: Normal rate, regular rhythm and normal heart sounds.  Pulmonary/Chest: Effort normal and breath sounds normal.  Lymphadenopathy:    He has no cervical adenopathy.   BP 130/78   Pulse 81   Temp 98.1 F (36.7 C) (Oral)   Resp 16   Wt 218 lb 12.8 oz (99.2 kg)   SpO2 98%   BMI 30.52 kg/m       Assessment & Plan:    Allergic reaction, initial encounter - Plan: predniSONE (STERAPRED UNI-PAK 21 TAB) 10 MG (21) TBPK tablet  He will continue on daily allergy medication regimen and add the steroid dose pak. Follow up as needed.

## 2017-04-10 ENCOUNTER — Encounter: Payer: Self-pay | Admitting: Family Medicine

## 2017-04-15 ENCOUNTER — Encounter: Payer: Self-pay | Admitting: Family Medicine

## 2017-04-16 ENCOUNTER — Telehealth: Payer: Self-pay

## 2017-04-16 NOTE — Telephone Encounter (Signed)
Called pt to let him know that he had 5 refill on his cialis that was filled 01-2017. Pt stated he did not know he had refills. KH

## 2017-04-21 ENCOUNTER — Ambulatory Visit (INDEPENDENT_AMBULATORY_CARE_PROVIDER_SITE_OTHER): Payer: BLUE CROSS/BLUE SHIELD | Admitting: Family Medicine

## 2017-04-21 VITALS — BP 112/70 | HR 63 | Temp 98.1°F | Wt 221.2 lb

## 2017-04-21 DIAGNOSIS — Z23 Encounter for immunization: Secondary | ICD-10-CM | POA: Diagnosis not present

## 2017-04-21 DIAGNOSIS — Z113 Encounter for screening for infections with a predominantly sexual mode of transmission: Secondary | ICD-10-CM | POA: Diagnosis not present

## 2017-04-21 NOTE — Addendum Note (Signed)
Addended by: Renelda LomaHENRY, Terrilee Dudzik on: 04/21/2017 04:12 PM   Modules accepted: Orders

## 2017-04-21 NOTE — Progress Notes (Signed)
   Subjective:    Patient ID: Jeff Hoffman, male    DOB: 08/15/1970, 47 y.o.   MRN: [id number]  HPI He is here for follow-up on his treatment.  He has had no discharge, dysuria, rashes of any kind.  He has had some intermittent tingling sensation over the dorsum of his right foot but cannot relate this to any particular activities, wearing shoes etc.   Review of Systems     Objective:   Physical Exam Alert and in no distress.  Exam of the foot shows normal skin turgor, color and sensation with good pulse.  No bony tenderness.       Assessment & Plan:  Screen for STD (sexually transmitted disease) - Plan: HIV antibody, GC/Chlamydia Probe Amp, RPR Reassured him that I did not find anything wrong with his foot.  Recommend watchful waiting.

## 2017-04-22 ENCOUNTER — Encounter: Payer: Self-pay | Admitting: Family Medicine

## 2017-04-22 LAB — RPR: RPR Ser Ql: NONREACTIVE

## 2017-04-22 LAB — HIV ANTIBODY (ROUTINE TESTING W REFLEX): HIV Screen 4th Generation wRfx: NONREACTIVE

## 2017-04-22 MED ORDER — EMTRICITABINE-TENOFOVIR DF 200-300 MG PO TABS: 1.0000 | ORAL_TABLET | Freq: Every day | ORAL | 2 refills | Status: DC

## 2017-04-22 NOTE — Addendum Note (Signed)
Addended by: Ronnald NianLALONDE, Taralynn Quiett C on: 04/22/2017 09:02 AM   Modules accepted: Orders

## 2017-04-25 ENCOUNTER — Telehealth: Payer: Self-pay | Admitting: Family Medicine

## 2017-04-25 LAB — GC/CHLAMYDIA PROBE AMP
Chlamydia trachomatis, NAA: NEGATIVE
Neisseria gonorrhoeae by PCR: NEGATIVE

## 2017-04-27 ENCOUNTER — Other Ambulatory Visit: Payer: Self-pay | Admitting: Family Medicine

## 2017-04-27 NOTE — Telephone Encounter (Signed)
Called t# [phone] & completed questions over the phone

## 2017-05-03 NOTE — Telephone Encounter (Signed)
P.A. CIALIS denied, plan only pays for #6 tablets per 30 days

## 2017-05-05 NOTE — Telephone Encounter (Signed)
ok 

## 2017-05-05 NOTE — Telephone Encounter (Signed)
Called pt and explained P.A. Denied for #30 but Approved but only #6 pills, he states cost $30 for #6 for 30 days.   Gave him option of [facility] tadalafil  #90 for $95 and he would like to switch to them.  Is this ok to switch to [facility]?

## 2017-05-06 MED ORDER — TADALAFIL 5 MG PO TABS: 5.0000 mg | ORAL_TABLET | Freq: Every day | ORAL | 1 refills | Status: DC | PRN

## 2017-05-06 NOTE — Telephone Encounter (Signed)
Rx switched to [facility]

## 2017-05-06 NOTE — Telephone Encounter (Signed)
done

## 2017-05-15 ENCOUNTER — Encounter: Payer: Self-pay | Admitting: Family Medicine

## 2017-05-20 MED ORDER — DOXYCYCLINE HYCLATE 100 MG PO TABS: 100.0000 mg | ORAL_TABLET | Freq: Two times a day (BID) | ORAL | 0 refills | Status: DC

## 2017-05-25 ENCOUNTER — Telehealth: Payer: Self-pay | Admitting: Family Medicine

## 2017-05-25 NOTE — Telephone Encounter (Signed)
P.A. QVAR 

## 2017-06-02 NOTE — Telephone Encounter (Signed)
Called pt and he states he is no longer using Qvar at this time and will let me know if needed

## 2017-06-13 ENCOUNTER — Encounter: Payer: Self-pay | Admitting: Family Medicine

## 2017-06-15 ENCOUNTER — Ambulatory Visit: Payer: BLUE CROSS/BLUE SHIELD | Admitting: Family Medicine

## 2017-06-15 ENCOUNTER — Encounter: Payer: Self-pay | Admitting: Family Medicine

## 2017-06-15 VITALS — BP 116/70 | HR 62 | Temp 97.9°F | Ht 74.0 in | Wt 221.2 lb

## 2017-06-15 DIAGNOSIS — N644 Mastodynia: Secondary | ICD-10-CM | POA: Diagnosis not present

## 2017-06-15 NOTE — Progress Notes (Signed)
   Subjective:    Patient ID: Jeff Hoffman, male    DOB: 07/19/1970, 47 y.o.   MRN: [id number]  HPI He is here for evaluation of left breast pain.  He noted the onset of the pain and warmth approximately 1 week ago.  About 4 days prior to that he did traumatize his breast tissue and did note an abrasion.  Over the last several days the swelling and pain have diminished.   Review of Systems     Objective:   Physical Exam Exam of the left breast shows slight thickening of the nipple but no actual lump.  It is nontender and is not red.       Assessment & Plan:  Breast pain, left I explained that he probably had a mild infection that the body is taking care of and was probably secondary to the abrasion.  He was comfortable with that.  I see no evidence of any underlying problem.

## 2017-06-25 ENCOUNTER — Encounter: Payer: Self-pay | Admitting: Family Medicine

## 2017-06-25 ENCOUNTER — Ambulatory Visit: Payer: BLUE CROSS/BLUE SHIELD | Admitting: Family Medicine

## 2017-06-25 VITALS — BP 110/66 | HR 67 | Temp 98.4°F | Wt 221.6 lb

## 2017-06-25 DIAGNOSIS — L989 Disorder of the skin and subcutaneous tissue, unspecified: Secondary | ICD-10-CM | POA: Diagnosis not present

## 2017-06-25 DIAGNOSIS — R221 Localized swelling, mass and lump, neck: Secondary | ICD-10-CM

## 2017-06-25 NOTE — Progress Notes (Addendum)
  Subjective:     Patient ID: Jeff PihShannon A Hoffman, male   DOB: 01/11/1970, 47 y.o.   MRN: [id number]  HPI He presents today with a raised bump on his upper back. First noticed yesterday by massage therapist, was not present at last therapy visit 2 weeks ago. Denies pain, warmth, redness, fevers, headaches, recent infections, steriod use or injury. Never had this before. States he does squats and that is where the barbell rests. Was seen on 6/10 for an infection in his left breast which went away on his own. States he has lost flexibility in his neck several months ago which is why he goes to massage therapist. Patient very concerned.   Review of Systems     Objective:   Physical Exam Alert and in no distress.  Exam of his lower neck shows prominence of the skin over the T1 posterior spine.  It is full but not well demarcated.  Not warm, hot or tender.  Good motion of the neck.    Assessment:    Skin lesion      Plan:    Skin lesion - Plan: DG Cervical Spine Complete   The prominence of the skin in this area is most likely from the barbell resting on that and the body's response to that.  I will get a x-ray on the area just to be safe and to assuage the patient   He was seen in conjunction with Philipp Deputyhelsea Sanayah Munro

## 2017-06-26 ENCOUNTER — Ambulatory Visit
Admission: RE | Admit: 2017-06-26 | Discharge: 2017-06-26 | Disposition: A | Discharge status: Home / Self Care | Payer: BLUE CROSS/BLUE SHIELD | Source: Ambulatory Visit | Attending: Family Medicine | Admitting: Family Medicine

## 2017-06-26 ENCOUNTER — Encounter: Payer: Self-pay | Admitting: Family Medicine

## 2017-06-26 DIAGNOSIS — L989 Disorder of the skin and subcutaneous tissue, unspecified: Secondary | ICD-10-CM

## 2017-07-13 ENCOUNTER — Ambulatory Visit (INDEPENDENT_AMBULATORY_CARE_PROVIDER_SITE_OTHER): Payer: BLUE CROSS/BLUE SHIELD | Admitting: Orthopaedic Surgery

## 2017-07-13 ENCOUNTER — Encounter (INDEPENDENT_AMBULATORY_CARE_PROVIDER_SITE_OTHER): Payer: Self-pay | Admitting: Orthopaedic Surgery

## 2017-07-13 VITALS — BP 135/74 | HR 80 | Ht 74.0 in | Wt 223.0 lb

## 2017-07-13 DIAGNOSIS — R221 Localized swelling, mass and lump, neck: Secondary | ICD-10-CM | POA: Diagnosis not present

## 2017-07-13 NOTE — Progress Notes (Signed)
Office Visit Note   Patient: Jeff Hoffman           Date of Birth: 04/30/1970           MRN: [id number] Visit Date: 07/13/2017              Requested by: Jeff Nian, MD [address] [place], [place] [address] PCP: Jeff Nian, MD   Assessment & Plan: Visit Diagnoses:  1. Localized swelling, mass or lump of neck     Plan: Probable sebaceous cyst near the C7 spinous process of the cervical spine without evidence of infection.  Doubt that this is a tumor.  Long discussion regarding diagnostic possibilities.  Suggest that Jeff Hoffman monitor the mass as it is asymptomatic.  Definitive treatment would be excisional biopsy.  Follow-Up Instructions: Return if symptoms worsen or fail to improve.   Orders:  No orders of the defined types were placed in this encounter.  No orders of the defined types were placed in this encounter.     Procedures: No procedures performed   Clinical Data: No additional findings.   Subjective: Chief Complaint  Patient presents with  . New Patient (Initial Visit)    KNOT AT BASE OF NECK FOR 2-3 WKS  Jeff Hoffman is a 47 years old and notes that he has had a mass at the base of his neck for approximately a month.  His massage therapist actually identified the "lump" has not had any pain numbness or tingling.  He has not had any skin rashes fever or chills.  He does lift weights and the bar typically will hit that area of his neck.  He did see Dr. Susann Hoffman, his primary care physician, obtain films of his neck.  I reviewed these on the PACS system without evidence of any abnormality along the C7 spinous process there are some degenerative changes at C4-5 and C5-6 as well as C6-7.  The mass is been completely asymptomatic.  He relates that he would not even "know it was there unless the therapist had mentioned it "  HPI  Review of Systems  Constitutional: Positive for fatigue. Negative for fever.  HENT: Negative for ear pain.   Eyes:  Negative for pain.  Respiratory: Negative for cough and shortness of breath.   Cardiovascular: Positive for leg swelling.  Gastrointestinal: Negative for constipation and diarrhea.  Genitourinary: Negative for difficulty urinating.  Musculoskeletal: Positive for back pain and neck pain.  Skin: Negative for rash.  Allergic/Immunologic: Negative for food allergies.  Neurological: Negative for weakness and numbness.  Hematological: Does not bruise/bleed easily.  Psychiatric/Behavioral: Negative for sleep disturbance.     Objective: Vital Signs: BP 135/74 (BP Location: Left Arm, Patient Position: Sitting, Cuff Size: Normal)   Pulse 80   Ht 6\' 2"  (1.88 m)   Wt 223 lb (101.2 kg)   BMI 28.63 kg/m   Physical Exam  Constitutional: He is oriented to person, place, and time. He appears well-developed and well-nourished.  HENT:  Mouth/Throat: Oropharynx is clear and moist.  Eyes: Pupils are equal, round, and reactive to light. EOM are normal.  Pulmonary/Chest: Effort normal.  Neurological: He is alert and oriented to person, place, and time.  Skin: Skin is warm and dry.  Psychiatric: He has a normal mood and affect. His behavior is normal.    Ortho Exam awake alert and oriented x3.  Comfortable sitting.  Painless range of motion of the cervical spine.  There is about a 1-1/4  inch x 1-1/4 inch mass overlying  the T1 or the C7 spinous process.  No drainage.  No increased heat.  Not erythematous.  No pain.  Does not feel cystic  Specialty Comments:  No specialty comments available.  Imaging: No results found.   PMFS History: Patient Active Problem List   Diagnosis Date Noted  . Localized swelling, mass or lump of neck 07/13/2017  . Proctitis 08/21/2016  . Need for Td vaccine 08/21/2016  . Screen for STD (sexually transmitted disease) 08/21/2016  . Loose stools 08/21/2016  . Lumbar foraminal stenosis 10/16/2015  . High risk sexual behavior 07/04/2015  . Erectile dysfunction  07/04/2015  . Asthma, chronic 02/28/2013  . GERD (gastroesophageal reflux disease) 02/28/2013  . Allergic rhinitis 02/28/2013  . Herpes 02/28/2013  . Hypogonadism male 06/21/2010   Past Medical History:  Diagnosis Date  . Allergy   . Asthma   . GERD (gastroesophageal reflux disease)   . Hypogonadism male     History reviewed. No pertinent family history.  Past Surgical History:  Procedure Laterality Date  . arthroscopic knee Right 2010  . BACK SURGERY  2004   L4, L5 partial laminectomy and discetomy  . FOOT SURGERY Left 2006  . HERNIA REPAIR Right 1990's   inguinal hernia  . LAPAROSCOPIC APPENDECTOMY N/A 05/05/2014   Procedure: APPENDECTOMY LAPAROSCOPIC;  Surgeon: Avel Peaceodd Rosenbower, MD;  Location: [facility];  Service: General;  Laterality: N/A;   Social History   Occupational History  . Not on file  Tobacco Use  . Smoking status: Former Smoker    Packs/day: 1.00    Years: 15.00    Pack years: 15.00    Last attempt to quit: 01/06/2001    Years since quitting: 16.5  . Smokeless tobacco: Never Used  Substance and Sexual Activity  . Alcohol use: No    Alcohol/week: 0.0 oz  . Drug use: No  . Sexual activity: Yes    Partners: Male

## 2017-08-03 ENCOUNTER — Ambulatory Visit: Payer: BLUE CROSS/BLUE SHIELD | Admitting: Family Medicine

## 2017-08-03 ENCOUNTER — Encounter: Payer: Self-pay | Admitting: Family Medicine

## 2017-08-03 VITALS — BP 104/58 | HR 82 | Temp 98.0°F | Ht 74.0 in | Wt 226.0 lb

## 2017-08-03 DIAGNOSIS — Z209 Contact with and (suspected) exposure to unspecified communicable disease: Secondary | ICD-10-CM

## 2017-08-03 NOTE — Progress Notes (Signed)
   Subjective:    Patient ID: Jeff Hoffman, male    DOB: 01/04/1971, 47 y.o.   MRN: [id number]  HPI He is here for follow-up on his Truvada and STI check.  He did have a lot of sexual activity last weekend and did use doxycycline however it did cause difficulty with GI distress.  He is having no dysuria, frequency or urgency.   Review of Systems     Objective:   Physical Exam Alert and in no distress otherwise not examined       Assessment & Plan:  Contact with or exposure to communicable disease - Plan: RPR+HIV+GC+CT Panel

## 2017-08-05 LAB — RPR+HIV+GC+CT PANEL
CHLAMYDIA, DNA PROBE: NEGATIVE
HIV Screen 4th Generation wRfx: NONREACTIVE
NEISSERIA GONORRHOEAE BY PCR: NEGATIVE
RPR: NONREACTIVE

## 2017-08-05 MED ORDER — EMTRICITABINE-TENOFOVIR DF 200-300 MG PO TABS: 1.0000 | ORAL_TABLET | Freq: Every day | ORAL | 0 refills | Status: DC

## 2017-08-05 NOTE — Addendum Note (Signed)
Addended by: Ronnald NianLALONDE, JOHN C on: 08/05/2017 10:10 AM   Modules accepted: Orders

## 2017-08-11 ENCOUNTER — Encounter (INDEPENDENT_AMBULATORY_CARE_PROVIDER_SITE_OTHER): Payer: Self-pay | Admitting: Orthopaedic Surgery

## 2017-08-11 NOTE — Telephone Encounter (Signed)
Please call and let him know we will schedule excision of the mass

## 2017-09-02 ENCOUNTER — Telehealth (INDEPENDENT_AMBULATORY_CARE_PROVIDER_SITE_OTHER): Payer: Self-pay | Admitting: Orthopaedic Surgery

## 2017-09-02 NOTE — Telephone Encounter (Signed)
Left message on patient's voicemail requesting that he call me directly at [phone] to set up surgery with Dr. Cleophas DunkerWhitfield.  His message indicates that faster reply through texting, but we do not have the capability from our office.  We may suggest MY CHART.

## 2017-09-02 NOTE — Telephone Encounter (Signed)
Patient stopped by the office to request a call to discuss the status of upcoming surgery. Please call patient to advise.

## 2017-09-04 ENCOUNTER — Telehealth (INDEPENDENT_AMBULATORY_CARE_PROVIDER_SITE_OTHER): Payer: Self-pay | Admitting: Orthopaedic Surgery

## 2017-09-04 NOTE — Telephone Encounter (Signed)
°  Patient is scheduled for excision mass cervical spine on Sept 26th and has a post op appointment on Sept 30th.  Patient has a trainer at his gym and needs to know how soon he will be able to workout after having this excision. Will he be able to workout the Wednesday after his surgery?   Please call patient

## 2017-09-04 NOTE — Telephone Encounter (Signed)
Patient is scheduled for excision mass cervical spine on Sept 26th, 2019 and has a post op appointment Sept 30th, 2019.  He would like to know if he will be able to work out on Oct

## 2017-09-08 NOTE — Telephone Encounter (Signed)
About 10 days

## 2017-09-08 NOTE — Telephone Encounter (Signed)
Notified pt no workouts for 10 days

## 2017-09-08 NOTE — Telephone Encounter (Signed)
Please advise 

## 2017-09-09 ENCOUNTER — Ambulatory Visit (INDEPENDENT_AMBULATORY_CARE_PROVIDER_SITE_OTHER): Payer: BLUE CROSS/BLUE SHIELD | Admitting: Family Medicine

## 2017-09-09 VITALS — BP 104/68 | HR 69 | Temp 98.1°F | Wt 221.2 lb

## 2017-09-09 DIAGNOSIS — Z23 Encounter for immunization: Secondary | ICD-10-CM

## 2017-09-09 DIAGNOSIS — M7661 Achilles tendinitis, right leg: Secondary | ICD-10-CM

## 2017-09-09 DIAGNOSIS — M48061 Spinal stenosis, lumbar region without neurogenic claudication: Secondary | ICD-10-CM

## 2017-09-09 DIAGNOSIS — Z8249 Family history of ischemic heart disease and other diseases of the circulatory system: Secondary | ICD-10-CM

## 2017-09-09 DIAGNOSIS — L989 Disorder of the skin and subcutaneous tissue, unspecified: Secondary | ICD-10-CM | POA: Diagnosis not present

## 2017-09-09 DIAGNOSIS — M9983 Other biomechanical lesions of lumbar region: Secondary | ICD-10-CM | POA: Diagnosis not present

## 2017-09-09 DIAGNOSIS — M7662 Achilles tendinitis, left leg: Secondary | ICD-10-CM

## 2017-09-09 NOTE — Progress Notes (Signed)
   Subjective:    Patient ID: Jeff Hoffman, male    DOB: 1970-05-15, 47 y.o.   MRN: [id number]  HPI He is here for consult concerning multiple issues.  He has a lesion on his left buttock that has now starting to cause a little bit of itching.  He also did a lot of walking when he was in [place] and is having some bilateral heel discomfort.  Also he noted a re-exacerbation of some back pain while he was riding in a car and had a lot of bumps.  He has a previous history of L4-5 radiculopathy with an MRI in 2003 showing a stenosis.  He did have surgery in 2004 as well as a fusion in 2017. He also has concerns over the fact that his father apparently died at age 3 of an MI.  He is also noted some hand and feet swelling which had concerned about his heart history.   Review of Systems     Objective:   Physical Exam Alert and in no distress.  Exam of his gluteal area shows a 2 to 3 mm papule.  Exam of both feet does show some slight tenderness palpation at the insertion of the Achilles tendon.  Full motion of the ankle.  No retrocalcaneal discomfort.  No tenderness over the calcaneal spur.  No edema noted in his hands or feet.  Straight leg raising was negative.Marland Kitchen  DTRs were diminished.       Assessment & Plan:  Skin lesion  Need for influenza vaccination - Plan: Flu Vaccine QUAD 6+ mos PF IM (Fluarix Quad PF)  Lumbar foraminal stenosis  Achilles tendinitis of both lower extremities  Family history of heart disease - Father died at age 22 of an MI The skin lesion was hyfrecated without difficulty.  Flu shot was given. Recommend conservative care for the tendinitis with an anti-inflammatory as well as wearing slight heel lift to see if that will help with it. Discussed the back pain.  He is to do his basic back exercises and get back into physical therapy.  If continued difficulty there, I will refer him back to the surgeons.  Explained that they would probably not want to do any surgery  but possibly epidurals. Then discussed the family history.  I explained that it is a gray zone issue in that the statistic change of his father had an MI at age 20.  He is aware of this.  Recommend diet, exercise and follow-up as needed.

## 2017-10-01 ENCOUNTER — Other Ambulatory Visit: Payer: Self-pay

## 2017-10-01 ENCOUNTER — Other Ambulatory Visit: Payer: Self-pay | Admitting: Family Medicine

## 2017-10-01 DIAGNOSIS — R221 Localized swelling, mass and lump, neck: Secondary | ICD-10-CM | POA: Diagnosis not present

## 2017-10-01 NOTE — Telephone Encounter (Signed)
[facility] pt tadalafil. Please advise [facility]

## 2017-10-02 ENCOUNTER — Encounter: Payer: Self-pay | Admitting: Family Medicine

## 2017-10-05 ENCOUNTER — Telehealth: Payer: Self-pay | Admitting: Family Medicine

## 2017-10-05 ENCOUNTER — Encounter (INDEPENDENT_AMBULATORY_CARE_PROVIDER_SITE_OTHER): Payer: Self-pay | Admitting: Orthopaedic Surgery

## 2017-10-05 ENCOUNTER — Ambulatory Visit (INDEPENDENT_AMBULATORY_CARE_PROVIDER_SITE_OTHER): Payer: BLUE CROSS/BLUE SHIELD | Admitting: Orthopaedic Surgery

## 2017-10-05 VITALS — BP 148/85 | HR 69 | Ht 74.0 in | Wt 226.0 lb

## 2017-10-05 DIAGNOSIS — R221 Localized swelling, mass and lump, neck: Secondary | ICD-10-CM

## 2017-10-05 NOTE — Progress Notes (Signed)
Office Visit Note   Patient: Jeff Hoffman           Date of Birth: 1970-09-30           MRN: [id number] Visit Date: 10/05/2017              Requested by: Ronnald Nian, MD [address] [place], [place] [address] PCP: Ronnald Nian, MD   Assessment & Plan: Visit Diagnoses:  1. Neck mass     Plan: 4 days status post excision of a mass from the posterior aspect of the cervical spine over the C7 spinous process.  This appeared to be similar to scar tissue.  Await biopsy report.  Wound is healing without problem  Follow-Up Instructions: Return in about 1 week (around 10/12/2017).   Orders:  No orders of the defined types were placed in this encounter.  No orders of the defined types were placed in this encounter.     Procedures: No procedures performed   Clinical Data: No additional findings.   Subjective: Chief Complaint  Patient presents with  . Follow-up    10/01/17 CYST REMOVAL ON BACK OF NECK. NO ISSUES DOING GREAT, DOSE HAVE SOME ITCHING  Not uncomfortable.  No longer taking any pain meds.  HPI  Review of Systems  Constitutional: Positive for fatigue. Negative for fever.  HENT: Negative for ear pain.   Eyes: Negative for pain.  Respiratory: Positive for shortness of breath. Negative for cough.   Cardiovascular: Negative for leg swelling.  Gastrointestinal: Negative for constipation and diarrhea.  Genitourinary: Negative for difficulty urinating.  Musculoskeletal: Positive for back pain and neck pain.  Skin: Negative for rash.  Allergic/Immunologic: Negative for food allergies.  Neurological: Positive for weakness. Negative for numbness.  Hematological: Does not bruise/bleed easily.  Psychiatric/Behavioral: Positive for sleep disturbance.     Objective: Vital Signs: BP (!) 148/85 (BP Location: Right Arm, Patient Position: Sitting, Cuff Size: Normal)   Pulse 69   Ht 6\' 2"  (1.88 m)   Wt 226 lb (102.5 kg)   BMI 29.02 kg/m   Physical  Exam  Ortho Exam incision healing without problem.  No drainage.  Little bit of fluid wave consistent with some small hematoma.  No redness.  Specialty Comments:  No specialty comments available.  Imaging: No results found.   PMFS History: Patient Active Problem List   Diagnosis Date Noted  . Neck mass 10/05/2017  . Family history of heart disease 09/09/2017  . Localized swelling, mass or lump of neck 07/13/2017  . Proctitis 08/21/2016  . Loose stools 08/21/2016  . Lumbar foraminal stenosis 10/16/2015  . High risk sexual behavior 07/04/2015  . Erectile dysfunction 07/04/2015  . Asthma, chronic 02/28/2013  . GERD (gastroesophageal reflux disease) 02/28/2013  . Allergic rhinitis 02/28/2013  . Herpes 02/28/2013  . Hypogonadism male 06/21/2010   Past Medical History:  Diagnosis Date  . Allergy   . Asthma   . GERD (gastroesophageal reflux disease)   . Hypogonadism male     History reviewed. No pertinent family history.  Past Surgical History:  Procedure Laterality Date  . arthroscopic knee Right 2010  . BACK SURGERY  2004   L4, L5 partial laminectomy and discetomy  . FOOT SURGERY Left 2006  . HERNIA REPAIR Right 1990's   inguinal hernia  . LAPAROSCOPIC APPENDECTOMY N/A 05/05/2014   Procedure: APPENDECTOMY LAPAROSCOPIC;  Surgeon: Avel Peace, MD;  Location: [facility];  Service: General;  Laterality: N/A;   Social History  Occupational History  . Not on file  Tobacco Use  . Smoking status: Former Smoker    Packs/day: 1.00    Years: 15.00    Pack years: 15.00    Last attempt to quit: 01/06/2001    Years since quitting: 16.7  . Smokeless tobacco: Never Used  Substance and Sexual Activity  . Alcohol use: No    Alcohol/week: 0.0 standard drinks  . Drug use: No  . Sexual activity: Yes    Partners: Male

## 2017-10-05 NOTE — Telephone Encounter (Signed)
P.A. OMEPRAZOLE done & approved til 10/01/20

## 2017-10-13 ENCOUNTER — Encounter (INDEPENDENT_AMBULATORY_CARE_PROVIDER_SITE_OTHER): Payer: Self-pay | Admitting: Orthopaedic Surgery

## 2017-10-13 ENCOUNTER — Ambulatory Visit (INDEPENDENT_AMBULATORY_CARE_PROVIDER_SITE_OTHER): Payer: BLUE CROSS/BLUE SHIELD | Admitting: Orthopaedic Surgery

## 2017-10-13 VITALS — BP 125/68 | HR 62 | Ht 74.0 in | Wt 226.0 lb

## 2017-10-13 DIAGNOSIS — R221 Localized swelling, mass and lump, neck: Secondary | ICD-10-CM

## 2017-10-13 NOTE — Progress Notes (Signed)
Office Visit Note   Patient: Jeff Hoffman           Date of Birth: August 26, 1970           MRN: [id number] Visit Date: 10/13/2017              Requested by: Ronnald Nian, MD [address] [place], [place] [address] PCP: Ronnald Nian, MD   Assessment & Plan: Visit Diagnoses:  1. Neck mass     Plan: 12 days status post excision of posterior neck mass and doing well.  I removed the subcuticular stitch.  Wound is healing nicely.  Discussed activity modification for the next week.  We will see in follow-up as needed.  Follow-Up Instructions: Return if symptoms worsen or fail to improve.   Orders:  No orders of the defined types were placed in this encounter.  No orders of the defined types were placed in this encounter.     Procedures: No procedures performed   Clinical Data: No additional findings.   Subjective: Chief Complaint  Patient presents with  . Follow-up    2 WK F/U ON NECK MASS REMOVE STITCHES    HPI  Review of Systems  Constitutional: Negative for fatigue and fever.  HENT: Negative for ear pain.   Eyes: Negative for pain.  Respiratory: Negative for cough and shortness of breath.   Cardiovascular: Negative for leg swelling.  Gastrointestinal: Negative for constipation and diarrhea.  Genitourinary: Negative for difficulty urinating.  Musculoskeletal: Positive for back pain. Negative for neck pain.  Skin: Negative for rash.  Allergic/Immunologic: Negative for food allergies.  Neurological: Positive for weakness. Negative for numbness.  Hematological: Does not bruise/bleed easily.  Psychiatric/Behavioral: Negative for sleep disturbance.     Objective: Vital Signs: BP 125/68 (BP Location: Right Arm, Patient Position: Sitting, Cuff Size: Normal)   Pulse 62   Ht 6\' 2"  (1.88 m)   Wt 226 lb (102.5 kg)   BMI 29.02 kg/m   Physical Exam  Ortho Exam posterior cervical spine incision is healing without problem.  No redness.  No drainage.   No tenderness.  I remove these single subcuticular stitch.  It looks perfectly intact  Specialty Comments:  No specialty comments available.  Imaging: No results found.   PMFS History: Patient Active Problem List   Diagnosis Date Noted  . Neck mass 10/05/2017  . Family history of heart disease 09/09/2017  . Localized swelling, mass or lump of neck 07/13/2017  . Proctitis 08/21/2016  . Loose stools 08/21/2016  . Lumbar foraminal stenosis 10/16/2015  . High risk sexual behavior 07/04/2015  . Erectile dysfunction 07/04/2015  . Asthma, chronic 02/28/2013  . GERD (gastroesophageal reflux disease) 02/28/2013  . Allergic rhinitis 02/28/2013  . Herpes 02/28/2013  . Hypogonadism male 06/21/2010   Past Medical History:  Diagnosis Date  . Allergy   . Asthma   . GERD (gastroesophageal reflux disease)   . Hypogonadism male     History reviewed. No pertinent family history.  Past Surgical History:  Procedure Laterality Date  . arthroscopic knee Right 2010  . BACK SURGERY  2004   L4, L5 partial laminectomy and discetomy  . FOOT SURGERY Left 2006  . HERNIA REPAIR Right 1990's   inguinal hernia  . LAPAROSCOPIC APPENDECTOMY N/A 05/05/2014   Procedure: APPENDECTOMY LAPAROSCOPIC;  Surgeon: Avel Peace, MD;  Location: [facility];  Service: General;  Laterality: N/A;   Social History   Occupational History  . Not on file  Tobacco  Use  . Smoking status: Former Smoker    Packs/day: 1.00    Years: 15.00    Pack years: 15.00    Last attempt to quit: 01/06/2001    Years since quitting: 16.7  . Smokeless tobacco: Never Used  Substance and Sexual Activity  . Alcohol use: No    Alcohol/week: 0.0 standard drinks  . Drug use: No  . Sexual activity: Yes    Partners: Male

## 2017-10-21 ENCOUNTER — Other Ambulatory Visit (INDEPENDENT_AMBULATORY_CARE_PROVIDER_SITE_OTHER): Payer: BLUE CROSS/BLUE SHIELD

## 2017-10-21 DIAGNOSIS — Z23 Encounter for immunization: Secondary | ICD-10-CM | POA: Diagnosis not present

## 2017-10-28 ENCOUNTER — Ambulatory Visit: Payer: BLUE CROSS/BLUE SHIELD | Admitting: Family Medicine

## 2017-10-28 ENCOUNTER — Encounter: Payer: Self-pay | Admitting: Family Medicine

## 2017-10-28 VITALS — BP 102/78 | HR 55 | Temp 97.7°F | Ht 73.5 in | Wt 215.4 lb

## 2017-10-28 DIAGNOSIS — F1021 Alcohol dependence, in remission: Secondary | ICD-10-CM | POA: Diagnosis not present

## 2017-10-28 DIAGNOSIS — J209 Acute bronchitis, unspecified: Secondary | ICD-10-CM

## 2017-10-28 MED ORDER — AMOXICILLIN 875 MG PO TABS: 875.0000 mg | ORAL_TABLET | Freq: Two times a day (BID) | ORAL | 0 refills | Status: DC

## 2017-10-28 NOTE — Progress Notes (Signed)
   Subjective:    Patient ID: Jeff Hoffman, male    DOB: May 11, 1970, 47 y.o.   MRN: [id number]  HPI He complains of a 3-week history of started with nasal congestion, rhinorrhea, PND with chest congestion and occasional headache.  It is been intermittent since then.  He does not smoke.  No sore throat, earache, fever or chills. He has been alcohol free for over 15 years and does go to [employer] several times per week.  Review of Systems     Objective:   Physical Exam Alert and in no distress. Tympanic membranes and canals are normal. Pharyngeal area is normal. Neck is supple without adenopathy or thyromegaly. Cardiac exam shows a regular sinus rhythm without murmurs or gallops. Lungs are clear to auscultation.       Assessment & Plan:  Acute bronchitis, unspecified organism - Plan: amoxicillin (AMOXIL) 875 MG tablet  Recovering alcoholic (HCC)

## 2017-10-28 NOTE — Patient Instructions (Signed)
Take all the antibiotic and if you are not totally back to normal when you finish call me

## 2017-11-09 ENCOUNTER — Other Ambulatory Visit: Payer: Self-pay | Admitting: Family Medicine

## 2017-11-12 ENCOUNTER — Encounter: Payer: Self-pay | Admitting: Family Medicine

## 2017-11-25 ENCOUNTER — Other Ambulatory Visit: Payer: Self-pay | Admitting: Family Medicine

## 2017-11-25 DIAGNOSIS — Z209 Contact with and (suspected) exposure to unspecified communicable disease: Secondary | ICD-10-CM

## 2017-11-25 NOTE — Telephone Encounter (Signed)
LVM for pt to call and make appt. KH 

## 2017-11-25 NOTE — Telephone Encounter (Signed)
He needs to come in for testing.

## 2017-11-25 NOTE — Telephone Encounter (Signed)
[facility] is requesting to fill pt truvada. Please advise. kh

## 2017-12-02 ENCOUNTER — Encounter: Payer: Self-pay | Admitting: Family Medicine

## 2017-12-02 ENCOUNTER — Ambulatory Visit: Payer: BLUE CROSS/BLUE SHIELD | Admitting: Family Medicine

## 2017-12-02 ENCOUNTER — Telehealth: Payer: Self-pay | Admitting: Family Medicine

## 2017-12-02 VITALS — BP 112/72 | HR 65 | Temp 98.1°F | Wt 212.4 lb

## 2017-12-02 DIAGNOSIS — Z113 Encounter for screening for infections with a predominantly sexual mode of transmission: Secondary | ICD-10-CM | POA: Diagnosis not present

## 2017-12-02 DIAGNOSIS — J4530 Mild persistent asthma, uncomplicated: Secondary | ICD-10-CM

## 2017-12-02 DIAGNOSIS — Z79899 Other long term (current) drug therapy: Secondary | ICD-10-CM

## 2017-12-02 DIAGNOSIS — J309 Allergic rhinitis, unspecified: Secondary | ICD-10-CM

## 2017-12-02 MED ORDER — FLUTICASONE PROPIONATE HFA 110 MCG/ACT IN AERO: 1.0000 | INHALATION_SPRAY | Freq: Two times a day (BID) | RESPIRATORY_TRACT | 12 refills | Status: DC

## 2017-12-02 MED ORDER — BECLOMETHASONE DIPROP HFA 40 MCG/ACT IN AERB: 1.0000 | INHALATION_SPRAY | Freq: Two times a day (BID) | RESPIRATORY_TRACT | 11 refills | Status: AC

## 2017-12-02 NOTE — Patient Instructions (Signed)
Try the branded Rhinocort first Start using the Flovent but if you need to use the rescue inhaler more than twice a week during the day or twice a month at night and we need to go with a higher dose

## 2017-12-02 NOTE — Telephone Encounter (Signed)
Pt was advised KH 

## 2017-12-02 NOTE — Telephone Encounter (Signed)
Let him know that I called it in 

## 2017-12-02 NOTE — Telephone Encounter (Signed)
Pt called and stated that is insurance will not cover flovent. They will pay for Qvar. Pt is asking for medication to be switched to Qvar. Please send to [facility]. Pt can be reached at [phone].

## 2017-12-02 NOTE — Progress Notes (Signed)
   Subjective:    Patient ID: Jeff Hoffman, male    DOB: 03/29/1970, 47 y.o.   MRN: [id number]  HPI He is here for a recheck.  He has had difficulty recently with nasal congestion, PND, chest tightness and the need to use his albuterol inhaler.  He has a previous history of asthma and has been tried on Advair, Flovent and Qvar.  For the last several years he has been able to do without the inhaler. He would also like follow-up blood work concerning HIV and continuing on Truvada.  No new sexual activity. Presently he is on a keto diet and has lost 2 inches in his waist.   Review of Systems     Objective:   Physical Exam Alert and in no distress otherwise not examined       Assessment & Plan:  Mild persistent chronic asthma without complication - Plan: fluticasone (FLOVENT HFA) 110 MCG/ACT inhaler  Screen for STD (sexually transmitted disease) - Plan: HIV Antibody (routine testing w rflx), RPR, CBC with Differential/Platelet, Comprehensive metabolic panel, GC/Chlamydia Probe Amp  Allergic rhinitis, unspecified seasonality, unspecified trigger  Encounter for long-term (current) use of medications He will start back on Flovent.  Discussed the use of albuterol in terms of keeping it to under twice per week during the day or twice per month at night.  Also recommended using Rhinocort.  May possibly give him Astelin down the road. Also discussed Truvada.  He would like to stay on that medication as he is having no difficulty.

## 2017-12-03 LAB — COMPREHENSIVE METABOLIC PANEL
ALK PHOS: 84 IU/L (ref 39–117)
ALT: 15 IU/L (ref 0–44)
AST: 16 IU/L (ref 0–40)
Albumin/Globulin Ratio: 2.3 — ABNORMAL HIGH (ref 1.2–2.2)
Albumin: 4.5 g/dL (ref 3.5–5.5)
BUN/Creatinine Ratio: 28 — ABNORMAL HIGH (ref 9–20)
BUN: 26 mg/dL — AB (ref 6–24)
Bilirubin Total: 0.4 mg/dL (ref 0.0–1.2)
CALCIUM: 9.5 mg/dL (ref 8.7–10.2)
CO2: 25 mmol/L (ref 20–29)
CREATININE: 0.94 mg/dL (ref 0.76–1.27)
Chloride: 103 mmol/L (ref 96–106)
GFR calc Af Amer: 110 mL/min/{1.73_m2} (ref >59)
GFR, EST NON AFRICAN AMERICAN: 95 mL/min/{1.73_m2} (ref >59)
GLOBULIN, TOTAL: 2 g/dL (ref 1.5–4.5)
GLUCOSE: 72 mg/dL (ref 65–99)
Potassium: 5.1 mmol/L (ref 3.5–5.2)
SODIUM: 140 mmol/L (ref 134–144)
Total Protein: 6.5 g/dL (ref 6.0–8.5)

## 2017-12-03 LAB — CBC WITH DIFFERENTIAL/PLATELET
Basophils Absolute: 0 10*3/uL (ref 0.0–0.2)
Basos: 1 %
EOS (ABSOLUTE): 0.1 10*3/uL (ref 0.0–0.4)
EOS: 2 %
HEMATOCRIT: 39 % (ref 37.5–51.0)
Hemoglobin: 12.8 g/dL — ABNORMAL LOW (ref 13.0–17.7)
IMMATURE GRANS (ABS): 0 10*3/uL (ref 0.0–0.1)
Immature Granulocytes: 0 %
LYMPHS: 31 %
Lymphocytes Absolute: 1.3 10*3/uL (ref 0.7–3.1)
MCH: 26.4 pg — ABNORMAL LOW (ref 26.6–33.0)
MCHC: 32.8 g/dL (ref 31.5–35.7)
MCV: 80 fL (ref 79–97)
MONOCYTES: 9 %
Monocytes Absolute: 0.4 10*3/uL (ref 0.1–0.9)
Neutrophils Absolute: 2.5 10*3/uL (ref 1.4–7.0)
Neutrophils: 57 %
Platelets: 193 10*3/uL (ref 150–450)
RBC: 4.85 x10E6/uL (ref 4.14–5.80)
RDW: 15.4 % (ref 12.3–15.4)
WBC: 4.3 10*3/uL (ref 3.4–10.8)

## 2017-12-03 LAB — RPR: RPR Ser Ql: NONREACTIVE

## 2017-12-03 LAB — HIV ANTIBODY (ROUTINE TESTING W REFLEX): HIV Screen 4th Generation wRfx: NONREACTIVE

## 2017-12-05 LAB — GC/CHLAMYDIA PROBE AMP
Chlamydia trachomatis, NAA: NEGATIVE
Neisseria gonorrhoeae by PCR: NEGATIVE

## 2017-12-07 ENCOUNTER — Telehealth: Payer: Self-pay | Admitting: Family Medicine

## 2017-12-07 NOTE — Telephone Encounter (Signed)
P.A. Haywood PaoFLOVENT

## 2017-12-10 ENCOUNTER — Other Ambulatory Visit: Payer: Self-pay

## 2017-12-10 MED ORDER — MOMETASONE FUROATE 110 MCG/INH IN AEPB: 1.0000 | INHALATION_SPRAY | Freq: Two times a day (BID) | RESPIRATORY_TRACT | 0 refills | Status: DC

## 2017-12-10 NOTE — Telephone Encounter (Signed)
Done med was put in and advised pt per JCL. KH

## 2017-12-10 NOTE — Telephone Encounter (Signed)
P.A. Denied must use formulary alternatives, Arnuity ellipta, Asmanex, or Qvar Redihaler.  Do you want to switch?

## 2017-12-10 NOTE — Telephone Encounter (Signed)
asmanex 110

## 2017-12-11 NOTE — Telephone Encounter (Signed)
done

## 2017-12-23 ENCOUNTER — Ambulatory Visit: Payer: BLUE CROSS/BLUE SHIELD | Admitting: Family Medicine

## 2017-12-23 ENCOUNTER — Encounter: Payer: Self-pay | Admitting: Family Medicine

## 2017-12-23 VITALS — BP 120/80 | HR 62 | Temp 98.0°F | Wt 213.6 lb

## 2017-12-23 DIAGNOSIS — J209 Acute bronchitis, unspecified: Secondary | ICD-10-CM | POA: Diagnosis not present

## 2017-12-23 MED ORDER — AMOXICILLIN 875 MG PO TABS: 875.0000 mg | ORAL_TABLET | Freq: Two times a day (BID) | ORAL | 0 refills | Status: DC

## 2017-12-23 NOTE — Progress Notes (Signed)
   Subjective:    Patient ID: Jeff Hoffman, male    DOB: 05/19/1970, 47 y.o.   MRN: [id number]  HPI He complains of a 10-day history that started with sore throat, nasal congestion, rhinorrhea and PND.  He developed a productive cough 3 to 4 days ago.  No fever, chills, earache.  He does not smoke.   Review of Systems     Objective:   Physical Exam Alert and in no distress. Tympanic membranes and canals are normal. Pharyngeal area is normal. Neck is supple without adenopathy or thyromegaly. Cardiac exam shows a regular sinus rhythm without murmurs or gallops. Lungs are clear to auscultation.        Assessment & Plan:  Acute bronchitis, unspecified organism - Plan: amoxicillin (AMOXIL) 875 MG tablet He is to call if not entirely better when he finishes the antibiotic.

## 2017-12-25 ENCOUNTER — Other Ambulatory Visit: Payer: Self-pay | Admitting: Family Medicine

## 2017-12-25 DIAGNOSIS — Z209 Contact with and (suspected) exposure to unspecified communicable disease: Secondary | ICD-10-CM

## 2017-12-28 NOTE — Telephone Encounter (Signed)
Is this okay to refill? 

## 2018-01-23 ENCOUNTER — Other Ambulatory Visit: Payer: Self-pay | Admitting: Family Medicine

## 2018-01-23 DIAGNOSIS — Z209 Contact with and (suspected) exposure to unspecified communicable disease: Secondary | ICD-10-CM

## 2018-01-25 NOTE — Telephone Encounter (Signed)
[facility] is requesting to fill pt Truvada. Please advise [facility]

## 2018-02-15 ENCOUNTER — Encounter: Payer: Self-pay | Admitting: Family Medicine

## 2018-02-15 ENCOUNTER — Ambulatory Visit: Payer: BLUE CROSS/BLUE SHIELD | Admitting: Family Medicine

## 2018-02-15 VITALS — BP 118/76 | HR 107 | Temp 98.0°F | Wt 201.6 lb

## 2018-02-15 DIAGNOSIS — N3001 Acute cystitis with hematuria: Secondary | ICD-10-CM | POA: Diagnosis not present

## 2018-02-15 DIAGNOSIS — R81 Glycosuria: Secondary | ICD-10-CM | POA: Diagnosis not present

## 2018-02-15 DIAGNOSIS — J209 Acute bronchitis, unspecified: Secondary | ICD-10-CM | POA: Diagnosis not present

## 2018-02-15 DIAGNOSIS — R6889 Other general symptoms and signs: Secondary | ICD-10-CM | POA: Diagnosis not present

## 2018-02-15 LAB — POCT URINALYSIS DIP (PROADVANTAGE DEVICE)
Nitrite, UA: POSITIVE — AB
PH UA: 5 (ref 5.0–8.0)
RBC UA: NEGATIVE
SPECIFIC GRAVITY, URINE: 1.025

## 2018-02-15 LAB — GLUCOSE, POCT (MANUAL RESULT ENTRY): POC GLUCOSE: 121 mg/dL — AB (ref 70–99)

## 2018-02-15 LAB — POCT INFLUENZA A/B
Influenza A, POC: NEGATIVE
Influenza B, POC: NEGATIVE

## 2018-02-15 MED ORDER — CIPROFLOXACIN HCL 500 MG PO TABS: 500.0000 mg | ORAL_TABLET | Freq: Two times a day (BID) | ORAL | 0 refills | Status: DC

## 2018-02-15 NOTE — Progress Notes (Addendum)
   Subjective:    Patient ID: Jeff Hoffman, male    DOB: Oct 02, 1970, 48 y.o.   MRN: [id number]  HPI He started having difficulty with sore throat, nasal congestion, chest congestion and slight cough January 30.  This has continued and 2 days ago he developed fever, chills, myalgias, sweating.  At the same time that the symptoms started he also "sounded" using an instrument that was longer than he was used to.  Since then he is also had some dysuria as well as hematuria   Review of Systems     Objective:   Physical Exam Alert and in no distress. Tympanic membranes and canals are normal. Pharyngeal area is normal. Neck is supple without adenopathy or thyromegaly. Cardiac exam shows a regular sinus rhythm without murmurs or gallops. Lungs are clear to auscultation. Urine dipstick was positive       Assessment & Plan:  Acute cystitis with hematuria - Plan: Urine Culture, ciprofloxacin (CIPRO) 500 MG tablet  Acute bronchitis, unspecified organism I will treated with Cipro which should help the UTI as well as any possible bacterial infection causing the bronchitis.  Encouraged him to not do any more instrumentation of his penis. He is to also use Tylenol and Advil or Aleve for aches and pains as well as continue on Azo-Standard.  His culture came back indicating that the Cipro will not work.  I will place him on Augmentin which still has intermittent susceptibility.  Sensitivities are to other IV meds.

## 2018-02-16 ENCOUNTER — Ambulatory Visit: Payer: BLUE CROSS/BLUE SHIELD | Admitting: Family Medicine

## 2018-02-17 LAB — URINE CULTURE

## 2018-02-17 MED ORDER — AMOXICILLIN-POT CLAVULANATE 875-125 MG PO TABS: 1.0000 | ORAL_TABLET | Freq: Two times a day (BID) | ORAL | 0 refills | Status: DC

## 2018-02-17 NOTE — Addendum Note (Signed)
Addended by: Ronnald Nian on: 02/17/2018 05:20 PM   Modules accepted: Orders

## 2018-02-19 ENCOUNTER — Encounter: Payer: Self-pay | Admitting: Family Medicine

## 2018-02-21 ENCOUNTER — Encounter: Payer: Self-pay | Admitting: Family Medicine

## 2018-02-22 ENCOUNTER — Other Ambulatory Visit: Payer: Self-pay | Admitting: Family Medicine

## 2018-02-22 DIAGNOSIS — Z209 Contact with and (suspected) exposure to unspecified communicable disease: Secondary | ICD-10-CM

## 2018-02-22 NOTE — Telephone Encounter (Signed)
Is this ok to refill?  

## 2018-02-23 ENCOUNTER — Encounter: Payer: Self-pay | Admitting: Family Medicine

## 2018-02-23 DIAGNOSIS — N3001 Acute cystitis with hematuria: Secondary | ICD-10-CM

## 2018-02-23 MED ORDER — FOSFOMYCIN TROMETHAMINE 3 G PO PACK: 3.0000 g | PACK | Freq: Once | ORAL | 0 refills | Status: AC

## 2018-02-23 NOTE — Telephone Encounter (Signed)
He has not responded to Augmentin.  I discussed this with Dr. Orvan Falconer and he recommended using fosfomycin.

## 2018-02-23 NOTE — Telephone Encounter (Signed)
Rerouting to you.  

## 2018-02-24 NOTE — Telephone Encounter (Signed)
Have him set up an appointment towards the end of the month.

## 2018-02-24 NOTE — Telephone Encounter (Signed)
Pt says he will call back to make the appt due to him dealing with the death of his mother. KH

## 2018-03-04 ENCOUNTER — Ambulatory Visit: Payer: BLUE CROSS/BLUE SHIELD | Admitting: Family Medicine

## 2018-03-04 ENCOUNTER — Encounter: Payer: Self-pay | Admitting: Family Medicine

## 2018-03-04 VITALS — BP 110/62 | HR 64 | Temp 97.6°F | Ht 74.0 in | Wt 206.0 lb

## 2018-03-04 DIAGNOSIS — Z87440 Personal history of urinary (tract) infections: Secondary | ICD-10-CM

## 2018-03-04 DIAGNOSIS — F4321 Adjustment disorder with depressed mood: Secondary | ICD-10-CM

## 2018-03-04 DIAGNOSIS — F1021 Alcohol dependence, in remission: Secondary | ICD-10-CM

## 2018-03-04 DIAGNOSIS — F5102 Adjustment insomnia: Secondary | ICD-10-CM

## 2018-03-04 DIAGNOSIS — R3915 Urgency of urination: Secondary | ICD-10-CM

## 2018-03-04 LAB — POCT URINALYSIS DIP (PROADVANTAGE DEVICE)
BILIRUBIN UA: NEGATIVE
BILIRUBIN UA: NEGATIVE mg/dL
Glucose, UA: NEGATIVE mg/dL
Leukocytes, UA: NEGATIVE
NITRITE UA: NEGATIVE
PH UA: 6 (ref 5.0–8.0)
Protein Ur, POC: NEGATIVE mg/dL
RBC UA: NEGATIVE
Specific Gravity, Urine: 1.015
Urobilinogen, Ur: NEGATIVE

## 2018-03-04 MED ORDER — ZOLPIDEM TARTRATE 10 MG PO TABS: 5.0000 mg | ORAL_TABLET | Freq: Every evening | ORAL | 0 refills | Status: AC | PRN

## 2018-03-04 NOTE — Patient Instructions (Signed)
We discussed trying the zolpidem at bedtime to see if this helps you sleep through the night.  Stop immediately if you have unusual behaviors. You can cut in half if it seems to strong. If it doesn't work at all, options would be to change to the CR (controlled release) form of ambien, or try very low doses of alprazolam when you wake up, if needed. Try some other relaxation techniques, as we discussed (visualization).  Consider cranberry juice or cranberry extract tablets to try and prevent UTI's (usually taken just at the earliest onset of symptoms). Your urine today was completely clear, no evidence of recurrent or persistent infection.  Consider grief counseling through Hospice as an adjunct to your current treatments.

## 2018-03-04 NOTE — Progress Notes (Signed)
Chief Complaint  Patient presents with  . Follow-up    saw [facility] on 02/15/18 for UTI. Wanted to see if it has resolved still having some urgency and night sweats the past two nights (mild). Also lost his mother on 03/25/18 so he mentions that he could be having issues with this as well. Wonders if he could maybe have small rx for [place] since he not sleeping.     Saw Dr. Susann Givens 02/15/18 with dysuria and hematuria. He was originally placed on cipro, but was changed to Augmentin which had intermediate susceptibility (susceptible ABX were all IV). Culture showed E.coli.  He did not respond to Augmentin, so was then treated 25-Mar-2022 with one dose of fosfomycin (after consulting ID).  Slight "sense of needing to go", urgency.  Mild night sweats the last couple of nights. Had been having full sweats early on with the infection, so this concerns him a little. No hematuria, dysuria. No significant change in caffeine intake (even though tired, due to not sleeping well), or other med changes.  His mother passed away 25-Mar-2018 and he is having trouble sleeping. She had a ruptured aneurysm.  He is waking up during the night and can't get back to sleep. Wakes up between 12 and 4am. Can play games, read be ready to doze back off, but when he rolls over, he is wide awake again. By 2pm he is "hitting a wall, shutting down", exhausted.  He is an alcoholic, in recovery. Is meeting with his sponsor today, also has seen a therapist.  In addition to the grief about the loss of his mother, having a tough time with his stepfather and financial issues.  He is planning to move to [facility] in a few months.  In recovery alcohol x16 years and also admits to problems with diet pills in the past.  Therefore, he is very hesitant to take anything for sleep/nerves, but is asking for small amount.  PMH, PSH, SH reviewed  Outpatient Encounter Medications as of 03/04/2018  Medication Sig  . Albuterol Sulfate (PROAIR RESPICLICK) 108 (90  BASE) MCG/ACT AEPB Inhale 2 puffs into the lungs 4 (four) times daily as needed.  Marland Kitchen aspirin EC 81 MG tablet Take 81 mg by mouth daily.  . beclomethasone (QVAR REDIHALER) 40 MCG/ACT inhaler Inhale 1 puff into the lungs 2 (two) times daily.  Marland Kitchen Fexofenadine HCl (ALLEGRA PO) Take 1 tablet by mouth daily.  . fluticasone (FLONASE) 50 MCG/ACT nasal spray Place 1 spray into both nostrils 2 (two) times daily.  . Magnesium 200 MG TABS Take 200 mg by mouth daily.   . montelukast (SINGULAIR) 10 MG tablet TAKE 1 TABLET ONCE DAILY.  . Multiple Vitamin (MULTIVITAMIN) tablet Take 1 tablet by mouth daily.  . Omega-3 Fatty Acids (FISH OIL) 1000 MG CAPS Take 4,000 mg by mouth daily.   Marland Kitchen omeprazole (PRILOSEC) 40 MG capsule TAKE 1 CAPSULE DAILY.  Marland Kitchen polycarbophil (FIBERCON) 625 MG tablet Take 625 mg by mouth daily.  . pseudoephedrine (SUDAFED) 60 MG tablet Take 60 mg by mouth every 4 (four) hours as needed for congestion.  . tadalafil (CIALIS) 5 MG tablet TAKE 1 TABLET BY MOUTH DAILY AS NEEDED FOR ERECTILE DYSFUNCTION.  . TRUVADA 200-300 MG tablet TAKE 1 TABLET BY MOUTH DAILY.  Marland Kitchen ibuprofen (ADVIL,MOTRIN) 200 MG tablet Take 200 mg by mouth every 6 (six) hours as needed for headache or moderate pain. Reported on 06/13/2015  . naproxen sodium (ANAPROX) 220 MG tablet Take 220 mg by mouth  2 (two) times daily with a meal.  . zolpidem (AMBIEN) 10 MG tablet Take 0.5-1 tablets (5-10 mg total) by mouth at bedtime as needed for up to 30 days for sleep.  . [DISCONTINUED] amoxicillin (AMOXIL) 875 MG tablet Take 1 tablet (875 mg total) by mouth 2 (two) times daily. (Patient not taking: Reported on 02/15/2018)  . [DISCONTINUED] amoxicillin-clavulanate (AUGMENTIN) 875-125 MG tablet Take 1 tablet by mouth 2 (two) times daily.  . [DISCONTINUED] ciprofloxacin (CIPRO) 500 MG tablet Take 1 tablet (500 mg total) by mouth 2 (two) times daily.  . [DISCONTINUED] guaiFENesin (MUCINEX) 600 MG 12 hr tablet Take 600 mg by mouth 2 (two) times daily.   . [DISCONTINUED] Mometasone Furoate (ASMANEX, 30 METERED DOSES,) 110 MCG/INH AEPB Inhale 1 puff into the lungs 2 (two) times daily. (Patient not taking: Reported on 02/15/2018)  . [DISCONTINUED] Phenyleph-Doxylamine-DM-APAP (NYQUIL SEVERE COLD/FLU PO) Take 2 capsules by mouth.  . [DISCONTINUED] Testosterone 20.25 MG/ACT (1.62%) GEL Place 3 Squirts onto the skin daily.     No facility-administered encounter medications on file as of 03/04/2018.    (not taking zolpidem prior to today's visit)  Allergies  Allergen Reactions  . Erythromycin Other (See Comments)    Sharp stomach pains  . Other Rash and Other (See Comments)    (Tegaderm) Pt had clear tape in operating room that caused bad blisters. Was changed to paper tape and cloth tape and they worked well   ROS:  No known fever or chills, just some mild sweats the last 2 nights.  No nausea, vomiting, diarrhea (some last week, resolved).  +fatigue, sad/tearful as per HPI.  +mild urinary urgency per HPI.   PHYSICAL EXAM:  BP 110/62   Pulse 64   Temp 97.6 F (36.4 C) (Tympanic)   Ht 6\' 2"  (1.88 m)   Wt 206 lb (93.4 kg)   BMI 26.45 kg/m   Well developed male in no distress. He appears sad, and intermittently tearful during his visit, in discussing his current situation (stressors and loss). HEENT: conjunctiva and sclera are clear, EOMI Neck: no lymphadenopathy or mass Heart: regular rate and rhythm Lungs: clear bilaterally, no wheezes, rales, ronchi Back: no CVA tenderness Abdomen: soft, nontender, no mass. Skin: normal turgor, no visible rash Psych: depressed mood, tearful but full range of affect. Normal hygiene, grooming, eye contact and speech Neuro: alert and oriented, cranial nerves intact, normal gait  Urine dip: entirely negative   ASSESSMENT/PLAN:  Urinary urgency - reassured no e/o persistent/recurrent UTI. Consider cranberry juice/extract tabs. contact us if sx persist/worsen - Plan: POCT Urinalysis DIP (Proadvantage  Device)  History of UTI - s/p augmentin and fosfomycin--night sweats had resolved, only slight recurrence x2 nights and slight urgency noted. repeat urine clear today - Plan: POCT Urinalysis DIP (Proadvantage Device)  Adjustment insomnia - counseled re: risks/side effects of meds, visualization technique - Plan: zolpidem (AMBIEN) 10 MG tablet  Grief reaction  Recovering alcoholic (HCC) - use caution with meds; he asked only for small quantity of ambien, declined alprazolam, will discuss meds with his sponsor   25 min visit, more than 1/2 spent counseling (re: meds, grief, urinary concerns)

## 2018-03-11 ENCOUNTER — Encounter: Payer: Self-pay | Admitting: Family Medicine

## 2018-03-11 ENCOUNTER — Ambulatory Visit: Payer: BLUE CROSS/BLUE SHIELD | Admitting: Family Medicine

## 2018-03-11 VITALS — BP 102/68 | HR 57 | Temp 98.0°F | Wt 204.8 lb

## 2018-03-11 DIAGNOSIS — F5102 Adjustment insomnia: Secondary | ICD-10-CM

## 2018-03-11 DIAGNOSIS — Z209 Contact with and (suspected) exposure to unspecified communicable disease: Secondary | ICD-10-CM

## 2018-03-11 MED ORDER — TRAZODONE HCL 50 MG PO TABS: ORAL_TABLET | ORAL | 0 refills | Status: AC

## 2018-03-11 MED ORDER — EMTRICITABINE-TENOFOVIR DF 200-300 MG PO TABS: 1.0000 | ORAL_TABLET | Freq: Every day | ORAL | 0 refills | Status: DC

## 2018-03-11 NOTE — Progress Notes (Signed)
   Subjective:    Patient ID: Jeff Hoffman, male    DOB: 09-11-1970, 48 y.o.   MRN: [id number]  HPI He is here for a recheck.  He has not had any sexual activity since July.  He was checked in November and all this tests were normal.  Blood work over the last several years is all been normal.  Recently he has had difficulty with insomnia and was given Ambien.  This is relating around the fact that his mother recently died relatively quickly.  He seems to be handling this fairly well.   Review of Systems     Objective:   Physical Exam Alert and in no distress otherwise not examined       Assessment & Plan:  Adjustment insomnia - Plan: traZODone (DESYREL) 50 MG tablet  Contact with or exposure to communicable disease - Plan: emtricitabine-tenofovir (TRUVADA) 200-300 MG tablet I will renew his Truvada since his last test was several months after his last sexual activity.  His blood work has always been normal and I feel comfortable waiting till a later time to repeat all of this. I will also give him trazodone to see if that will help with sleep.  His main issue was waking up.  The Ambien is a short acting hypnotic medication and so hopefully trazodone will work better.

## 2018-03-22 ENCOUNTER — Encounter: Payer: Self-pay | Admitting: Family Medicine

## 2018-03-22 ENCOUNTER — Other Ambulatory Visit: Payer: Self-pay | Admitting: Family Medicine

## 2018-03-22 DIAGNOSIS — Z23 Encounter for immunization: Secondary | ICD-10-CM

## 2018-03-23 ENCOUNTER — Other Ambulatory Visit: Payer: Self-pay

## 2018-03-23 ENCOUNTER — Other Ambulatory Visit (INDEPENDENT_AMBULATORY_CARE_PROVIDER_SITE_OTHER): Payer: BLUE CROSS/BLUE SHIELD

## 2018-03-23 DIAGNOSIS — Z23 Encounter for immunization: Secondary | ICD-10-CM

## 2018-04-07 ENCOUNTER — Other Ambulatory Visit: Payer: Self-pay | Admitting: Family Medicine

## 2018-04-08 NOTE — Telephone Encounter (Signed)
Is this okay to refill? 

## 2018-05-26 ENCOUNTER — Other Ambulatory Visit: Payer: Self-pay | Admitting: Family Medicine

## 2018-05-26 DIAGNOSIS — Z209 Contact with and (suspected) exposure to unspecified communicable disease: Secondary | ICD-10-CM

## 2018-05-26 NOTE — Telephone Encounter (Signed)
[facility] is requesting to fill pt truvada. Please advise [facility] 

## 2018-06-22 ENCOUNTER — Encounter: Payer: Self-pay | Admitting: Family Medicine

## 2018-06-23 ENCOUNTER — Other Ambulatory Visit: Payer: Self-pay

## 2018-06-23 ENCOUNTER — Other Ambulatory Visit: Payer: Self-pay | Admitting: Family Medicine

## 2018-06-23 ENCOUNTER — Encounter: Payer: Self-pay | Admitting: Family Medicine

## 2018-06-23 DIAGNOSIS — Z209 Contact with and (suspected) exposure to unspecified communicable disease: Secondary | ICD-10-CM

## 2018-06-23 MED ORDER — OMEPRAZOLE 40 MG PO CPDR: 40.0000 mg | DELAYED_RELEASE_CAPSULE | Freq: Every day | ORAL | 0 refills | Status: DC

## 2018-06-23 MED ORDER — TADALAFIL 5 MG PO TABS: ORAL_TABLET | ORAL | 1 refills | Status: DC

## 2018-06-23 NOTE — Telephone Encounter (Signed)
[facility] is requesting to fill pt truvada. Please advise [facility]

## 2018-07-09 ENCOUNTER — Encounter: Payer: Self-pay | Admitting: Family Medicine

## 2018-08-03 ENCOUNTER — Other Ambulatory Visit: Payer: Self-pay | Admitting: Family Medicine

## 2018-08-03 DIAGNOSIS — Z209 Contact with and (suspected) exposure to unspecified communicable disease: Secondary | ICD-10-CM

## 2018-08-03 NOTE — Telephone Encounter (Signed)
Is this ok to refill?  Patient moved to [place]

## 2018-09-03 ENCOUNTER — Other Ambulatory Visit: Payer: Self-pay | Admitting: Family Medicine

## 2018-09-07 ENCOUNTER — Other Ambulatory Visit: Payer: Self-pay | Admitting: Family Medicine

## 2018-09-07 DIAGNOSIS — Z209 Contact with and (suspected) exposure to unspecified communicable disease: Secondary | ICD-10-CM

## 2018-09-07 NOTE — Telephone Encounter (Signed)
[facility] is requesting to fill pt truvada and albuterol. Please advise [facility]

## 2018-09-07 NOTE — Telephone Encounter (Signed)
Let him know that he needs to find a doctor out in [place].  I cannot renew these things

## 2018-09-12 ENCOUNTER — Other Ambulatory Visit: Payer: Self-pay | Admitting: Family Medicine

## 2018-09-14 NOTE — Telephone Encounter (Signed)
Pharmacy is requesting to fill pt omeprazole . I think pt moved. Please advise if this is ok to fill. [place]

## 2019-04-05 ENCOUNTER — Other Ambulatory Visit: Payer: Self-pay | Admitting: Family Medicine

## 2019-07-09 ENCOUNTER — Other Ambulatory Visit: Payer: Self-pay | Admitting: Family Medicine

## 2019-07-12 ENCOUNTER — Telehealth: Payer: Self-pay

## 2019-07-12 NOTE — Telephone Encounter (Signed)
Received a fax from [facility] for a refill on the pts. Tadalafil 5 mg pt. Last apt was 03/11/18 and has no future apt.

## 2019-07-12 NOTE — Telephone Encounter (Signed)
[facility] is requesting to fill pt tadalafil. Please advise [facility]

## 2020-08-20 DIAGNOSIS — Z0279 Encounter for issue of other medical certificate: Secondary | ICD-10-CM
